# Patient Record
Sex: Male | Born: 1955 | Hispanic: Yes | Marital: Single | Smoking: Current some day smoker
Health system: Southern US, Community
[De-identification: ages and names within clinical notes are randomized; demographics above are authoritative.]

## PROBLEM LIST (undated history)

## (undated) DIAGNOSIS — D649 Anemia, unspecified: Secondary | ICD-10-CM

## (undated) DIAGNOSIS — I10 Essential (primary) hypertension: Secondary | ICD-10-CM

## (undated) DIAGNOSIS — R569 Unspecified convulsions: Secondary | ICD-10-CM

## (undated) DIAGNOSIS — E119 Type 2 diabetes mellitus without complications: Secondary | ICD-10-CM

## (undated) DIAGNOSIS — J449 Chronic obstructive pulmonary disease, unspecified: Secondary | ICD-10-CM

## (undated) HISTORY — PX: EXCISIONAL HEMORRHOIDECTOMY: SHX1541

## (undated) HISTORY — PX: OTHER SURGICAL HISTORY: SHX169

---

## 2013-08-06 ENCOUNTER — Emergency Department (HOSPITAL_COMMUNITY): Payer: Self-pay

## 2013-08-06 ENCOUNTER — Emergency Department (HOSPITAL_COMMUNITY)
Admission: EM | Admit: 2013-08-06 | Discharge: 2013-08-06 | Disposition: A | Discharge status: Home / Self Care | Payer: Self-pay | Attending: Emergency Medicine | Admitting: Emergency Medicine

## 2013-08-06 ENCOUNTER — Encounter (HOSPITAL_COMMUNITY): Payer: Self-pay | Admitting: Emergency Medicine

## 2013-08-06 DIAGNOSIS — R079 Chest pain, unspecified: Secondary | ICD-10-CM

## 2013-08-06 DIAGNOSIS — Z862 Personal history of diseases of the blood and blood-forming organs and certain disorders involving the immune mechanism: Secondary | ICD-10-CM | POA: Insufficient documentation

## 2013-08-06 DIAGNOSIS — J449 Chronic obstructive pulmonary disease, unspecified: Secondary | ICD-10-CM | POA: Insufficient documentation

## 2013-08-06 DIAGNOSIS — F17200 Nicotine dependence, unspecified, uncomplicated: Secondary | ICD-10-CM | POA: Insufficient documentation

## 2013-08-06 DIAGNOSIS — Z859 Personal history of malignant neoplasm, unspecified: Secondary | ICD-10-CM | POA: Insufficient documentation

## 2013-08-06 DIAGNOSIS — I10 Essential (primary) hypertension: Secondary | ICD-10-CM | POA: Insufficient documentation

## 2013-08-06 DIAGNOSIS — E119 Type 2 diabetes mellitus without complications: Secondary | ICD-10-CM | POA: Insufficient documentation

## 2013-08-06 DIAGNOSIS — I319 Disease of pericardium, unspecified: Secondary | ICD-10-CM | POA: Insufficient documentation

## 2013-08-06 DIAGNOSIS — Z8669 Personal history of other diseases of the nervous system and sense organs: Secondary | ICD-10-CM | POA: Insufficient documentation

## 2013-08-06 DIAGNOSIS — J4489 Other specified chronic obstructive pulmonary disease: Secondary | ICD-10-CM | POA: Insufficient documentation

## 2013-08-06 HISTORY — DX: Unspecified convulsions: R56.9

## 2013-08-06 HISTORY — DX: Type 2 diabetes mellitus without complications: E11.9

## 2013-08-06 HISTORY — DX: Anemia, unspecified: D64.9

## 2013-08-06 HISTORY — DX: Essential (primary) hypertension: I10

## 2013-08-06 HISTORY — DX: Chronic obstructive pulmonary disease, unspecified: J44.9

## 2013-08-06 LAB — COMPREHENSIVE METABOLIC PANEL
ALBUMIN: 4.2 g/dL (ref 3.5–5.2)
ALT: 65 U/L — ABNORMAL HIGH (ref 0–53)
AST: 51 U/L — AB (ref 0–37)
Alkaline Phosphatase: 104 U/L (ref 39–117)
BILIRUBIN TOTAL: 0.8 mg/dL (ref 0.3–1.2)
BUN: 19 mg/dL (ref 6–23)
CALCIUM: 9.5 mg/dL (ref 8.4–10.5)
CHLORIDE: 103 meq/L (ref 96–112)
CO2: 24 mEq/L (ref 19–32)
CREATININE: 0.94 mg/dL (ref 0.50–1.35)
GFR calc Af Amer: >90 mL/min (ref >90)
GFR calc non Af Amer: >90 mL/min (ref >90)
Glucose, Bld: 99 mg/dL (ref 70–99)
Potassium: 4 mEq/L (ref 3.7–5.3)
Sodium: 140 mEq/L (ref 137–147)
TOTAL PROTEIN: 8.2 g/dL (ref 6.0–8.3)

## 2013-08-06 LAB — CBC
HEMATOCRIT: 43 % (ref 39.0–52.0)
Hemoglobin: 14.8 g/dL (ref 13.0–17.0)
MCH: 34.3 pg — AB (ref 26.0–34.0)
MCHC: 34.4 g/dL (ref 30.0–36.0)
MCV: 99.8 fL (ref 78.0–100.0)
PLATELETS: 155 10*3/uL (ref 150–400)
RBC: 4.31 MIL/uL (ref 4.22–5.81)
RDW: 13.3 % (ref 11.5–15.5)
WBC: 4.3 10*3/uL (ref 4.0–10.5)

## 2013-08-06 LAB — TROPONIN I

## 2013-08-06 MED ORDER — ASPIRIN 325 MG PO TABS
325.0000 mg | ORAL_TABLET | Freq: Once | ORAL | Status: AC
  Administered 2013-08-06: 325 mg via ORAL
  Filled 2013-08-06: qty 1

## 2013-08-06 MED ORDER — NAPROXEN 500 MG PO TABS
500.0000 mg | ORAL_TABLET | Freq: Two times a day (BID) | ORAL | Status: DC
Start: 2013-08-06 — End: 2013-09-02

## 2013-08-06 MED ORDER — KETOROLAC TROMETHAMINE 30 MG/ML IJ SOLN
30.0000 mg | Freq: Once | INTRAMUSCULAR | Status: AC
  Administered 2013-08-06: 30 mg via INTRAVENOUS
  Filled 2013-08-06: qty 1

## 2013-08-06 NOTE — ED Provider Notes (Signed)
CSN: [id number]     Arrival date & time 08/06/13  [id number] History   First MD Initiated Contact with Patient 08/06/13 [phone]     Chief Complaint  Patient presents with  . Chest Pain     HPI Is reports rather constant left-sided pleuritic sharp chest pain with some radiation into his left arm.  Feels like he has some tingling and warmth in his left arm.  This is been rather constant over the past 2 days.  He's been told before in the past he has "silent heart attacks".  He's never had a heart catheterization.  [place] in [place].  [place].  Patient has a history of COPD, tobacco abuse, hypertension, diabetes.   Past Medical History  Diagnosis Date  . Diabetes mellitus without complication   . Hypertension   . COPD (chronic obstructive pulmonary disease)   . Cancer   . Anemia   . Seizures    Past Surgical History  Procedure Laterality Date  . Head surgery    . Excisional hemorrhoidectomy     Family History  Problem Relation Age of Onset  . Stroke Mother   . Cancer Father    History  Substance Use Topics  . Smoking status: Current Some Day Smoker -- 1.00 packs/day    Types: Cigarettes  . Smokeless tobacco: Never Used  . Alcohol Use: Yes     Comment: 5 days/week    Review of Systems  All other systems reviewed and are negative.     Allergies  Review of patient's allergies indicates no known allergies.  Home Medications   Prior to Admission medications   Not on File   BP 187/102  Pulse 51  Temp(Src) 99.2 F (37.3 C) (Oral)  Resp 14  SpO2 98% Physical Exam  Nursing note and vitals reviewed. Constitutional: He is oriented to person, place, and time. He appears well-developed and well-nourished.  HENT:  Head: Normocephalic and atraumatic.  Eyes: EOM are normal.  Neck: Normal range of motion.  Cardiovascular: Normal rate, regular rhythm, normal heart sounds and intact distal pulses.   Pulmonary/Chest: Effort normal and  breath sounds normal. No respiratory distress. He exhibits no tenderness.  Abdominal: Soft. He exhibits no distension. There is no tenderness.  Musculoskeletal: Normal range of motion.  Neurological: He is alert and oriented to person, place, and time.  Skin: Skin is warm and dry.  Psychiatric: He has a normal mood and affect. Judgment normal.    ED Course  Procedures (including critical care time) Labs Review Labs Reviewed  CBC - Abnormal; Notable for the following:    MCH 34.3 (*)    All other components within normal limits  COMPREHENSIVE METABOLIC PANEL - Abnormal; Notable for the following:    AST 51 (*)    ALT 65 (*)    All other components within normal limits  TROPONIN I  TROPONIN I    Imaging Review Dg Chest 2 View  08/06/2013   CLINICAL DATA:  Chest pain  EXAM: CHEST  2 VIEW  COMPARISON:  None.  FINDINGS: There is no edema or consolidation. The heart size and pulmonary vascularity are normal. No adenopathy. No bone lesions.  IMPRESSION: No edema or consolidation   Electronically Signed   By: Bretta BangWilliam  Woodruff M.D.   On: 08/06/2013 09:15     EKG Interpretation   Date/Time:  Monday Aug 06 2013 08:28:31 EDT Ventricular Rate:  57 PR Interval:  149 QRS Duration:  98 QT Interval:  412 QTC Calculation: 401 R Axis:   66 Text Interpretation:  Sinus rhythm diffuse ST elevation, ? pericarditis No  old tracing to compare Confirmed by Jaekwon Mcclune  MD, Caryn BeeKEVIN ([id number]) on 08/06/2013  8:44:16 AM      MDM   Final diagnoses:  Chest pain  Pericarditis    Symptoms sound more consistent with pericarditis given the short nature of his pain in his diffuse ST changes throughout his EKG without reciprocal depression.  Questionable PR depression.  Patient will be treated with Toradol.  Enzymes will be cycled.  Patient does have significant cardiac risk factors therefore 2 sets of cardiac enzymes will be obtained.  We'll continue to evaluate the patient.  His pain worsens he will get a  repeat EKG.  We'll monitor him closely.   12:00 PM Patient feels better at this time.  Discharge home in good condition.  Outpatient and cardiology followup.  Patient may need stress testing as an outpatient.  My suspicion is that this more represents pericarditis   Lyanne CoKevin M Susan Arana, MD 08/06/13 1200

## 2013-08-06 NOTE — ED Notes (Signed)
Pt family is at bedside and states pt pain is worse when he is drinking.

## 2013-08-06 NOTE — ED Notes (Signed)
Patient states he has had chest pain x 2 days intermittently and came today because he had 2 episodes of chest pain in one day. Patient states he had SOB and weakness. Patient denies any N/v. Chest pain radiates to left arm and is tingling.

## 2013-08-06 NOTE — ED Notes (Signed)
Pt is on keppra 500 mg BID Enalapril 5 mg daily Pt family member states he is not taking his medication every day and has not had them today.

## 2013-08-06 NOTE — ED Notes (Signed)
MD at bedside.  EDP Campos present at  discharge

## 2013-08-06 NOTE — ED Notes (Signed)
Transported to xray 

## 2013-08-06 NOTE — ED Notes (Signed)
Pt last drink was Saturday.

## 2013-08-06 NOTE — Discharge Instructions (Signed)
Chest Pain (Nonspecific) °It is often hard to give a specific diagnosis for the cause of chest pain. There is always a chance that your pain could be related to something serious, such as a heart attack or a blood clot in the lungs. You need to follow up with your caregiver for further evaluation. °CAUSES  °· Heartburn. °· Pneumonia or bronchitis. °· Anxiety or stress. °· Inflammation around your heart (pericarditis) or lung (pleuritis or pleurisy). °· A blood clot in the lung. °· A collapsed lung (pneumothorax). It can develop suddenly on its own (spontaneous pneumothorax) or from injury (trauma) to the chest. °· Shingles infection (herpes zoster virus). °The chest wall is composed of bones, muscles, and cartilage. Any of these can be the source of the pain. °· The bones can be bruised by injury. °· The muscles or cartilage can be strained by coughing or overwork. °· The cartilage can be affected by inflammation and become sore (costochondritis). °DIAGNOSIS  °Lab tests or other studies, such as X-rays, electrocardiography, stress testing, or cardiac imaging, may be needed to find the cause of your pain.  °TREATMENT  °· Treatment depends on what may be causing your chest pain. Treatment may include: °· Acid blockers for heartburn. °· Anti-inflammatory medicine. °· Pain medicine for inflammatory conditions. °· Antibiotics if an infection is present. °· You may be advised to change lifestyle habits. This includes stopping smoking and avoiding alcohol, caffeine, and chocolate. °· You may be advised to keep your head raised (elevated) when sleeping. This reduces the chance of acid going backward from your stomach into your esophagus. °· Most of the time, nonspecific chest pain will improve within 2 to 3 days with rest and mild pain medicine. °HOME CARE INSTRUCTIONS  °· If antibiotics were prescribed, take your antibiotics as directed. Finish them even if you start to feel better. °· For the next few days, avoid physical  activities that bring on chest pain. Continue physical activities as directed. °· Do not smoke. °· Avoid drinking alcohol. °· Only take over-the-counter or prescription medicine for pain, discomfort, or fever as directed by your caregiver. °· Follow your caregiver's suggestions for further testing if your chest pain does not go away. °· Keep any follow-up appointments you made. If you do not go to an appointment, you could develop lasting (chronic) problems with pain. If there is any problem keeping an appointment, you must call to reschedule. °SEEK MEDICAL CARE IF:  °· You think you are having problems from the medicine you are taking. Read your medicine instructions carefully. °· Your chest pain does not go away, even after treatment. °· You develop a rash with blisters on your chest. °SEEK IMMEDIATE MEDICAL CARE IF:  °· You have increased chest pain or pain that spreads to your arm, neck, jaw, back, or abdomen. °· You develop shortness of breath, an increasing cough, or you are coughing up blood. °· You have severe back or abdominal pain, feel nauseous, or vomit. °· You develop severe weakness, fainting, or chills. °· You have a fever. °THIS IS AN EMERGENCY. Do not wait to see if the pain will go away. Get medical help at once. Call your local emergency services (911 in [place] Do not drive yourself to the hospital. °MAKE SURE YOU:  °· Understand these instructions. °· Will watch your condition. °· Will get help right away if you are not doing well or get worse. °Document Released: 12/16/2004 Document Revised: 05/31/2011 Document Reviewed: 10/12/2007 °ExitCare® Patient Information ©2014 [employer],   LLC.  Pericarditis Pericarditis is swelling (inflammation) of the pericardium. The pericardium is a thin, double-layered, fluid-filled tissue sac that surrounds the heart. The purpose of the pericardium is to contain the heart in the chest cavity and keep the heart from overexpanding. Different types of pericarditis  can occur, such as:  Acute pericarditis. Inflammation can develop suddenly in acute pericarditis.  Chronic pericarditis. Inflammation develops gradually and is long-lasting in chronic pericarditis.  Constrictive pericarditis. In this type of pericarditis, the layers of the pericardium stiffen and develop scar tissue. The scar tissue thickens and sticks together. This makes it difficult for the heart to pump and work as it normally does. CAUSES  Pericarditis can be caused from different conditions, such as:  A bacterial, fungal or viral infection.  After a heart attack (myocardial infarction).  After open-heart surgery (coronary bypass graft surgery).  Auto-immune conditions such as lupus, rheumatoid arthritis or scleroderma.  Kidney failure.  Low thyroid condition (hypothyroidism).  Cancer from another part of the body that has spread (metastasized) to the pericardium.  Chest injury or trauma.  After radiation treatment.  Certain medicines. SYMPTOMS  Symptoms of pericarditis can include:  Chest pain. Chest pain symptoms may increase when laying down and may be relieved when sitting up and leaning forward.  A chronic, dry cough.  Heart palpitations. These may feel like rapid, fluttering or pounding heart beats.  Chest pain may be worse when swallowing.  Dizziness or fainting.  Tiredness, fatigue or lethargy.  Fever. DIAGNOSIS  Pericarditis is diagnosed by the following:  A physical exam. A heart sound called a pericardial friction rub may be heard when your caregiver listens to your heart.  Blood work. Blood may be drawn to check for an infection and to look at your blood chemistry.  Electrocardiography. During electrocardiography your heart's electrical activity is monitored and recorded with a tracing on paper (electrocardiogram [ECG]).  Echocardiography.  Computed tomography (CT).  Magnetic resonance image (MRI). TREATMENT  To treat pericarditis, it is  important to know the cause of it. The cause of pericarditis determines the treatment.   If the cause of pericarditis is due to an infection, treatment is based on the type of infection. If an infection is suspected in the pericardial fluid, a procedure called a pericardial fluid culture and biopsy may be done. This takes a sample of the pericardial fluid. The sample is sent to a lab which runs tests on the pericardial fluid to check for an infection.  If the autoimmune disease is the cause, treatment of the autoimmune condition will help improve the pericarditis.  If the cause of pericarditis is not known, anti-inflammatory medicines may be used to help decrease the inflammation.  Surgery may be needed. The following are types of surgeries or procedures that may be done to treat pericarditis:  Pericardial window. A pericardial window makes a cut (incision) into the pericardial sac. This allows excess fluid in the pericardium to drain.  Pericardiocentesis. A pericardiocentesis is also known as a pericardial tap. This procedure uses a needle that is guided by X-ray to drain (aspirate) excess fluid from the pericardium.  Pericardiectomy. A pericardiectomy removes part or all of the pericardium. HOME CARE INSTRUCTIONS   Do not smoke. If you smoke, quit. Your caregiver can help you quit smoking.  Maintain a healthy weight.  Follow an exercise program as told by your caregiver.  If you drink alcohol, do so in moderation.  Eat a heart healthy diet. A registered dietician can help  you learn about healthy food choices.  Keep a list of all your medicines with you at all times. Include the name, dose, how often it is taken and how it is taken. SEEK IMMEDIATE MEDICAL CARE IF:   You have chest pain or feelings of chest pressure.  You have sweating (diaphoresis) when at rest.  You have irregular heartbeats (palpitations).  You have rapid, racing heart beats.  You have unexplained fainting  episodes.  You feel sick to your stomach (nausea) or vomiting without cause.  You have unexplained weakness. If you develop any of the symptoms which originally made you seek care, call for local emergency medical help. Do not drive yourself to the hospital. Document Released: 09/01/2000 Document Revised: 05/31/2011 Document Reviewed: 03/10/2011 [facility] Patient Information 2014 [employer] [place] [place].

## 2013-08-06 NOTE — ED Notes (Signed)
Pt states he has intermittent chest pain since saturday. Pt has not started any new exercise and has not performed strenuous activity over the weekend.

## 2013-08-06 NOTE — ED Notes (Signed)
Pt drinks 4 40 oz beers 5 times a week.

## 2013-08-06 NOTE — Progress Notes (Signed)
[facility] provided pt with a list of primary care resources to help patient establish primary care.  °

## 2013-09-02 ENCOUNTER — Encounter (HOSPITAL_COMMUNITY): Payer: Self-pay | Admitting: Emergency Medicine

## 2013-09-02 ENCOUNTER — Emergency Department (HOSPITAL_COMMUNITY): Payer: Self-pay

## 2013-09-02 ENCOUNTER — Emergency Department (HOSPITAL_COMMUNITY)
Admission: EM | Admit: 2013-09-02 | Discharge: 2013-09-02 | Disposition: A | Discharge status: Home / Self Care | Payer: Self-pay | Attending: Emergency Medicine | Admitting: Emergency Medicine

## 2013-09-02 DIAGNOSIS — Z791 Long term (current) use of non-steroidal anti-inflammatories (NSAID): Secondary | ICD-10-CM | POA: Insufficient documentation

## 2013-09-02 DIAGNOSIS — R079 Chest pain, unspecified: Secondary | ICD-10-CM

## 2013-09-02 DIAGNOSIS — J4489 Other specified chronic obstructive pulmonary disease: Secondary | ICD-10-CM | POA: Insufficient documentation

## 2013-09-02 DIAGNOSIS — M79609 Pain in unspecified limb: Secondary | ICD-10-CM | POA: Insufficient documentation

## 2013-09-02 DIAGNOSIS — J449 Chronic obstructive pulmonary disease, unspecified: Secondary | ICD-10-CM | POA: Insufficient documentation

## 2013-09-02 DIAGNOSIS — F1010 Alcohol abuse, uncomplicated: Secondary | ICD-10-CM | POA: Insufficient documentation

## 2013-09-02 DIAGNOSIS — Z862 Personal history of diseases of the blood and blood-forming organs and certain disorders involving the immune mechanism: Secondary | ICD-10-CM | POA: Insufficient documentation

## 2013-09-02 DIAGNOSIS — G40909 Epilepsy, unspecified, not intractable, without status epilepticus: Secondary | ICD-10-CM | POA: Insufficient documentation

## 2013-09-02 DIAGNOSIS — I10 Essential (primary) hypertension: Secondary | ICD-10-CM | POA: Insufficient documentation

## 2013-09-02 DIAGNOSIS — Z859 Personal history of malignant neoplasm, unspecified: Secondary | ICD-10-CM | POA: Insufficient documentation

## 2013-09-02 DIAGNOSIS — R071 Chest pain on breathing: Secondary | ICD-10-CM | POA: Insufficient documentation

## 2013-09-02 DIAGNOSIS — F10929 Alcohol use, unspecified with intoxication, unspecified: Secondary | ICD-10-CM

## 2013-09-02 DIAGNOSIS — E119 Type 2 diabetes mellitus without complications: Secondary | ICD-10-CM | POA: Insufficient documentation

## 2013-09-02 DIAGNOSIS — Z79899 Other long term (current) drug therapy: Secondary | ICD-10-CM | POA: Insufficient documentation

## 2013-09-02 DIAGNOSIS — I252 Old myocardial infarction: Secondary | ICD-10-CM | POA: Insufficient documentation

## 2013-09-02 LAB — BASIC METABOLIC PANEL
BUN: 13 mg/dL (ref 6–23)
CO2: 20 mEq/L (ref 19–32)
Calcium: 9.2 mg/dL (ref 8.4–10.5)
Chloride: 106 mEq/L (ref 96–112)
Creatinine, Ser: 1.01 mg/dL (ref 0.50–1.35)
GFR, EST NON AFRICAN AMERICAN: 81 mL/min — AB (ref >90)
Glucose, Bld: 100 mg/dL — ABNORMAL HIGH (ref 70–99)
Potassium: 3.8 mEq/L (ref 3.7–5.3)
SODIUM: 141 meq/L (ref 137–147)

## 2013-09-02 LAB — CBC WITH DIFFERENTIAL/PLATELET
BASOS PCT: 1 % (ref 0–1)
Basophils Absolute: 0 10*3/uL (ref 0.0–0.1)
EOS ABS: 0.3 10*3/uL (ref 0.0–0.7)
Eosinophils Relative: 5 % (ref 0–5)
HCT: 40.7 % (ref 39.0–52.0)
Hemoglobin: 14.1 g/dL (ref 13.0–17.0)
Lymphocytes Relative: 55 % — ABNORMAL HIGH (ref 12–46)
Lymphs Abs: 3.6 10*3/uL (ref 0.7–4.0)
MCH: 34.2 pg — AB (ref 26.0–34.0)
MCHC: 34.6 g/dL (ref 30.0–36.0)
MCV: 98.8 fL (ref 78.0–100.0)
Monocytes Absolute: 0.6 10*3/uL (ref 0.1–1.0)
Monocytes Relative: 10 % (ref 3–12)
NEUTROS ABS: 1.8 10*3/uL (ref 1.7–7.7)
NEUTROS PCT: 29 % — AB (ref 43–77)
Platelets: 174 10*3/uL (ref 150–400)
RBC: 4.12 MIL/uL — AB (ref 4.22–5.81)
RDW: 13.4 % (ref 11.5–15.5)
WBC: 6.4 10*3/uL (ref 4.0–10.5)

## 2013-09-02 LAB — I-STAT TROPONIN, ED
TROPONIN I, POC: 0.01 ng/mL (ref 0.00–0.08)
Troponin i, poc: 0 ng/mL (ref 0.00–0.08)

## 2013-09-02 LAB — RAPID URINE DRUG SCREEN, HOSP PERFORMED
Amphetamines: NOT DETECTED
Barbiturates: NOT DETECTED
Benzodiazepines: NOT DETECTED
Cocaine: NOT DETECTED
Opiates: POSITIVE — AB
Tetrahydrocannabinol: NOT DETECTED

## 2013-09-02 MED ORDER — MORPHINE SULFATE 4 MG/ML IJ SOLN
4.0000 mg | Freq: Once | INTRAMUSCULAR | Status: AC
  Administered 2013-09-02: 4 mg via INTRAVENOUS
  Filled 2013-09-02: qty 1

## 2013-09-02 MED ORDER — IBUPROFEN 600 MG PO TABS: 600.0000 mg | ORAL_TABLET | Freq: Four times a day (QID) | ORAL | Status: DC | PRN

## 2013-09-02 MED ORDER — TRAMADOL HCL 50 MG PO TABS: 50.0000 mg | ORAL_TABLET | Freq: Four times a day (QID) | ORAL | Status: DC | PRN

## 2013-09-02 MED ORDER — ONDANSETRON HCL 4 MG/2ML IJ SOLN
4.0000 mg | Freq: Once | INTRAMUSCULAR | Status: AC
  Administered 2013-09-02: 4 mg via INTRAVENOUS
  Filled 2013-09-02: qty 2

## 2013-09-02 NOTE — ED Notes (Signed)
Per EMS ,pt.from home with complaint of chest pain which started at 1200 midnight,pt. Claimed that chest pain only happened every time he steps on his right foot and this has been going on and off for 2 years!!! Alert and oriented x 3,  Etoh.  No SOB.

## 2013-09-02 NOTE — Discharge Instructions (Signed)
Alcohol Intoxication  Alcohol intoxication occurs when you drink enough alcohol that it affects your ability to function. It can be mild or very severe. Drinking a lot of alcohol in a short time is called binge drinking. This can be very harmful. Drinking alcohol can also be more dangerous if you are taking medicines or other drugs. Some of the effects caused by alcohol may include:  · Loss of coordination.  · Changes in mood and behavior.  · Unclear thinking.  · Trouble talking (slurred speech).  · Throwing up (vomiting).  · Confusion.  · Slowed breathing.  · Twitching and shaking (seizures).  · Loss of consciousness.  HOME CARE  · Do not drive after drinking alcohol.  · Drink enough water and fluids to keep your pee (urine) clear or pale yellow. Avoid caffeine.  · Only take medicine as told by your doctor.  GET HELP IF:  · You throw up (vomit) many times.  · You do not feel better after a few days.  · You frequently have alcohol intoxication. Your doctor can help decide if you should see a substance use treatment counselor.  GET HELP RIGHT AWAY IF:  · You become shaky when you stop drinking.  · You have twitching and shaking.  · You throw up blood. It may look bright red or like coffee grounds.  · You notice blood in your poop (bowel movements).  · You become lightheaded or pass out (faint).  MAKE SURE YOU:   · Understand these instructions.  · Will watch your condition.  · Will get help right away if you are not doing well or get worse.  Document Released: 08/25/2007 Document Revised: 11/08/2012 Document Reviewed: 08/11/2012  ExitCare® Patient Information ©2014 [employer].

## 2013-09-02 NOTE — ED Provider Notes (Addendum)
CSN: [id number]     Arrival date & time 09/02/13  0048 History   First MD Initiated Contact with Patient 09/02/13 0059     Chief Complaint  Patient presents with  . Chest Pain  . right foot pain   . Alcohol Intoxication     (Consider location/radiation/quality/duration/timing/severity/associated sxs/prior Treatment) HPI Assessment 58 year old man who reports that he has a history of coronary artery disease-MI in the remote past. Past medical history is also notable for diabetes, hypertension, COPD, tobacco abuse.  He presents with sudden onset of left-sided chest pain which is aching. It began after he had a couple of beers. He denies abdominal pain. He has not had shortness of breath, diaphoresis or lightheadedness. Pain is sharp and radiates to left shoulder. Patient denies recent cocaine use.  Past Medical History  Diagnosis Date  . Diabetes mellitus without complication   . Hypertension   . COPD (chronic obstructive pulmonary disease)   . Cancer   . Anemia   . Seizures    Past Surgical History  Procedure Laterality Date  . Head surgery    . Excisional hemorrhoidectomy     Family History  Problem Relation Age of Onset  . Stroke Mother   . Cancer Father    History  Substance Use Topics  . Smoking status: Current Some Day Smoker -- 1.00 packs/day    Types: Cigarettes  . Smokeless tobacco: Never Used  . Alcohol Use: Yes     Comment: 5 days/week    Review of Systems Ten point review of symptoms performed and is negative with the exception of symptoms noted above.     Allergies  Review of patient's allergies indicates no known allergies.  Home Medications   Prior to Admission medications   Medication Sig Start Date End Date Taking? Authorizing Provider  albuterol (PROVENTIL HFA;VENTOLIN HFA) 108 (90 BASE) MCG/ACT inhaler Inhale 2 puffs into the lungs every 6 (six) hours as needed for wheezing or shortness of breath.    Historical Provider, MD  enalapril  (VASOTEC) 5 MG tablet Take 5 mg by mouth daily.    Historical Provider, MD  levETIRAcetam (KEPPRA) 500 MG tablet Take 500 mg by mouth 2 (two) times daily.    Historical Provider, MD  naproxen (NAPROSYN) 500 MG tablet Take 1 tablet (500 mg total) by mouth 2 (two) times daily. 08/06/13   Lyanne CoKevin M Campos, MD   BP 150/73  Pulse 74  Temp(Src) 98.4 F (36.9 C) (Oral)  Resp 19  SpO2 96% Physical Exam Gen: well developed and well nourished appearing Head: NCAT Eyes: PERL, EOMI Nose: no epistaixis or rhinorrhea Mouth/throat: mucosa is moist and pink Neck: supple, no stridor Chest wall: left sided chest wall tenderness with reproducible pain on palpation, pain with external rotation of left shoulder Lungs: CTA B, no wheezing, rhonchi or rales CV: RRR, no murmur, extremities appear well perfused.  Abd: soft, notender, nondistended Back: no ttp, no cva ttp Skin: warm and dry Ext: normal to inspection, no dependent edema Neuro: CN ii-xii grossly intact, no focal deficits Psyche; normal affect,  calm and cooperative.   ED Course  Procedures (including critical care time) Labs Review  EKG: nsr, no acute ischemic changes, normal intervals, normal axis, normal qrs complex  CXR: normal cardiac silloute, normal appearing mediastinum, no infiltrates, no acute process identified.  MDM   DDX: ACS, pneumothorax, pneumonia, pericardial or pleural effusion, gastritis, GERD/PUD, musculoskeletal pain.   Abdominal exam is benign. Suspect myofascial pain as the patient  is tender over the region of pain.    [id number]: ED work up non-diagnostic with normal first troponin. Patient is pain free. Will recheck 2nd set at 3 hour interval. If negative, plan to discharge home.   Brandt LoosenJulie Lavayah Vita, MD 09/02/13 [phone]  [id number]: Patient remains CP free. 2nd troponin is wnl.   Brandt LoosenJulie Javaeh Muscatello, MD 09/02/13 [phone]

## 2013-09-02 NOTE — ED Notes (Signed)
MD at bedside. 

## 2013-09-02 NOTE — ED Notes (Signed)
Bed: [id number] Expected date:  Expected time:  Means of arrival:  Comments: EMS 65M leg pain, chest pain, etoh

## 2013-09-02 NOTE — ED Notes (Signed)
Patient transported to X-ray 

## 2014-07-08 ENCOUNTER — Emergency Department (HOSPITAL_COMMUNITY)
Admission: EM | Admit: 2014-07-08 | Discharge: 2014-07-08 | Disposition: A | Discharge status: Home / Self Care | Payer: Medicaid Other | Attending: Emergency Medicine | Admitting: Emergency Medicine

## 2014-07-08 ENCOUNTER — Emergency Department (HOSPITAL_COMMUNITY): Payer: Self-pay

## 2014-07-08 ENCOUNTER — Encounter (HOSPITAL_COMMUNITY): Payer: Self-pay | Admitting: Emergency Medicine

## 2014-07-08 DIAGNOSIS — Z79899 Other long term (current) drug therapy: Secondary | ICD-10-CM | POA: Insufficient documentation

## 2014-07-08 DIAGNOSIS — E119 Type 2 diabetes mellitus without complications: Secondary | ICD-10-CM | POA: Insufficient documentation

## 2014-07-08 DIAGNOSIS — R079 Chest pain, unspecified: Secondary | ICD-10-CM | POA: Insufficient documentation

## 2014-07-08 DIAGNOSIS — Z85038 Personal history of other malignant neoplasm of large intestine: Secondary | ICD-10-CM | POA: Insufficient documentation

## 2014-07-08 DIAGNOSIS — Z720 Tobacco use: Secondary | ICD-10-CM | POA: Insufficient documentation

## 2014-07-08 DIAGNOSIS — I10 Essential (primary) hypertension: Secondary | ICD-10-CM | POA: Insufficient documentation

## 2014-07-08 DIAGNOSIS — J449 Chronic obstructive pulmonary disease, unspecified: Secondary | ICD-10-CM | POA: Insufficient documentation

## 2014-07-08 DIAGNOSIS — G40909 Epilepsy, unspecified, not intractable, without status epilepticus: Secondary | ICD-10-CM | POA: Insufficient documentation

## 2014-07-08 DIAGNOSIS — Z862 Personal history of diseases of the blood and blood-forming organs and certain disorders involving the immune mechanism: Secondary | ICD-10-CM | POA: Insufficient documentation

## 2014-07-08 LAB — BASIC METABOLIC PANEL
Anion gap: 10 (ref 5–15)
BUN: 18 mg/dL (ref 6–23)
CO2: 22 mmol/L (ref 19–32)
Calcium: 9.3 mg/dL (ref 8.4–10.5)
Chloride: 107 mmol/L (ref 96–112)
Creatinine, Ser: 0.92 mg/dL (ref 0.50–1.35)
GFR calc Af Amer: >90 mL/min (ref >90)
GFR calc non Af Amer: >90 mL/min (ref >90)
Glucose, Bld: 146 mg/dL — ABNORMAL HIGH (ref 70–99)
Potassium: 3.6 mmol/L (ref 3.5–5.1)
Sodium: 139 mmol/L (ref 135–145)

## 2014-07-08 LAB — CBC
HCT: 37.6 % — ABNORMAL LOW (ref 39.0–52.0)
Hemoglobin: 12.6 g/dL — ABNORMAL LOW (ref 13.0–17.0)
MCH: 34.8 pg — ABNORMAL HIGH (ref 26.0–34.0)
MCHC: 33.5 g/dL (ref 30.0–36.0)
MCV: 103.9 fL — ABNORMAL HIGH (ref 78.0–100.0)
Platelets: 171 10*3/uL (ref 150–400)
RBC: 3.62 MIL/uL — ABNORMAL LOW (ref 4.22–5.81)
RDW: 14.8 % (ref 11.5–15.5)
WBC: 4.7 10*3/uL (ref 4.0–10.5)

## 2014-07-08 LAB — TROPONIN I: Troponin I: <0.03 ng/mL (ref <0.031)

## 2014-07-08 LAB — I-STAT TROPONIN, ED: Troponin i, poc: 0 ng/mL (ref 0.00–0.08)

## 2014-07-08 LAB — BRAIN NATRIURETIC PEPTIDE: B Natriuretic Peptide: 13.6 pg/mL (ref 0.0–100.0)

## 2014-07-08 MED ORDER — IBUPROFEN 200 MG PO TABS
600.0000 mg | ORAL_TABLET | Freq: Once | ORAL | Status: AC
Start: 2014-07-08 — End: 2014-07-08
  Administered 2014-07-08: 600 mg via ORAL
  Filled 2014-07-08: qty 3

## 2014-07-08 MED ORDER — OXYCODONE-ACETAMINOPHEN 5-325 MG PO TABS
2.0000 | ORAL_TABLET | Freq: Once | ORAL | Status: AC
  Administered 2014-07-08: 2 via ORAL
  Filled 2014-07-08: qty 2

## 2014-07-08 NOTE — ED Notes (Signed)
Pt c/o sharp left sided chest pain, SOB, left sided arm and jaw pain, and lightheadedness onset several months ago.

## 2014-07-08 NOTE — Discharge Instructions (Signed)
Chest Pain (Nonspecific) °It is often hard to give a specific diagnosis for the cause of chest pain. There is always a chance that your pain could be related to something serious, such as a heart attack or a blood clot in the lungs. You need to follow up with your health care provider for further evaluation. °CAUSES  °· Heartburn. °· Pneumonia or bronchitis. °· Anxiety or stress. °· Inflammation around your heart (pericarditis) or lung (pleuritis or pleurisy). °· A blood clot in the lung. °· A collapsed lung (pneumothorax). It can develop suddenly on its own (spontaneous pneumothorax) or from trauma to the chest. °· Shingles infection (herpes zoster virus). °The chest wall is composed of bones, muscles, and cartilage. Any of these can be the source of the pain. °· The bones can be bruised by injury. °· The muscles or cartilage can be strained by coughing or overwork. °· The cartilage can be affected by inflammation and become sore (costochondritis). °DIAGNOSIS  °Lab tests or other studies may be needed to find the cause of your pain. Your health care provider may have you take a test called an ambulatory electrocardiogram (ECG). An ECG records your heartbeat patterns over a 24-hour period. You may also have other tests, such as: °· Transthoracic echocardiogram (TTE). During echocardiography, sound waves are used to evaluate how blood flows through your heart. °· Transesophageal echocardiogram (TEE). °· Cardiac monitoring. This allows your health care provider to monitor your heart rate and rhythm in real time. °· Holter monitor. This is a portable device that records your heartbeat and can help diagnose heart arrhythmias. It allows your health care provider to track your heart activity for several days, if needed. °· Stress tests by exercise or by giving medicine that makes the heart beat faster. °TREATMENT  °· Treatment depends on what may be causing your chest pain. Treatment may include: °¨ Acid blockers for  heartburn. °¨ Anti-inflammatory medicine. °¨ Pain medicine for inflammatory conditions. °¨ Antibiotics if an infection is present. °· You may be advised to change lifestyle habits. This includes stopping smoking and avoiding alcohol, caffeine, and chocolate. °· You may be advised to keep your head raised (elevated) when sleeping. This reduces the chance of acid going backward from your stomach into your esophagus. °Most of the time, nonspecific chest pain will improve within 2-3 days with rest and mild pain medicine.  °HOME CARE INSTRUCTIONS  °· If antibiotics were prescribed, take them as directed. Finish them even if you start to feel better. °· For the next few days, avoid physical activities that bring on chest pain. Continue physical activities as directed. °· Do not use any tobacco products, including cigarettes, chewing tobacco, or electronic cigarettes. °· Avoid drinking alcohol. °· Only take medicine as directed by your health care provider. °· Follow your health care provider's suggestions for further testing if your chest pain does not go away. °· Keep any follow-up appointments you made. If you do not go to an appointment, you could develop lasting (chronic) problems with pain. If there is any problem keeping an appointment, call to reschedule. °SEEK MEDICAL CARE IF:  °· Your chest pain does not go away, even after treatment. °· You have a rash with blisters on your chest. °· You have a fever. °SEEK IMMEDIATE MEDICAL CARE IF:  °· You have increased chest pain or pain that spreads to your arm, neck, jaw, back, or abdomen. °· You have shortness of breath. °· You have an increasing cough, or you cough   up blood. °· You have severe back or abdominal pain. °· You feel nauseous or vomit. °· You have severe weakness. °· You faint. °· You have chills. °This is an emergency. Do not wait to see if the pain will go away. Get medical help at once. Call your local emergency services (911 in [place] Do not drive  yourself to the hospital. °MAKE SURE YOU:  °· Understand these instructions. °· Will watch your condition. °· Will get help right away if you are not doing well or get worse. °Document Released: 12/16/2004 Document Revised: 03/13/2013 Document Reviewed: 10/12/2007 °ExitCare® Patient Information ©2015 [employer]. This information is not intended to replace advice given to you by your health care provider. Make sure you discuss any questions you have with your health care provider. ° ° °Emergency Department Resource Guide °1) Find a Doctor and Pay Out of Pocket °Although you won't have to find out who is covered by your insurance plan, it is a good idea to ask around and get recommendations. You will then need to call the office and see if the doctor you have chosen will accept you as a new patient and what types of options they offer for patients who are self-pay. Some doctors offer discounts or will set up payment plans for their patients who do not have insurance, but you will need to ask so you aren't surprised when you get to your appointment. ° °2) Contact Your Local Health Department °Not all health departments have doctors that can see patients for sick visits, but many do, so it is worth a call to see if yours does. If you don't know where your local health department is, you can check in your phone book. The CDC also has a tool to help you locate your state's health department, and many state websites also have listings of all of their local health departments. ° °3) Find a Walk-in Clinic °If your illness is not likely to be very severe or complicated, you may want to try a walk in clinic. These are popping up all over the country in pharmacies, drugstores, and shopping centers. They're usually staffed by nurse practitioners or physician assistants that have been trained to treat common illnesses and complaints. They're usually fairly quick and inexpensive. However, if you have serious medical issues or  chronic medical problems, these are probably not your best option. ° °No Primary Care Doctor: °- Call [facility] at  832-8000 - they can help you locate a primary care doctor that  accepts your insurance, provides certain services, etc. °- Physician Referral Service- 1-800-533-3463 ° °Chronic Pain Problems: °Organization         Address  Phone   Notes  °[facility]  (336) 297-2271 Patients need to be referred by their primary care doctor.  ° °Medication Assistance: °Organization         Address  Phone   Notes  °[place] [facility] [address] °[place], [place] [address] (336) 641-8030 --Must be a resident of [place] °-- Must have NO insurance coverage whatsoever (no Medicaid/ Medicare, etc.) °-- The pt. MUST have a primary care doctor that directs their care regularly and follows them in the community °  °[facility]  (866) 331-1348   °[employer]  (888) 892-1162   ° °Agencies that provide inexpensive medical care: °Organization         Address  Phone   Notes  °[facility]  (  336) 832-8035   °[facility]    (336) 832-7272   °[facility] [address] °[place], [place] [address] (336) 832-4777   °[facility] [address], °[place] (336) 271-4999   °[facility]    (336) 373-0678   °[facility]    (336) 272-1050   °[facility] ° [address], [place] Phone:  (336) 832-4444, Fax:  (336) 832-4440 Hours of Operation:  9 am - 6 pm, M-F.  Also accepts Medicaid/Medicare and self-pay.  °[facility] ° [address], [place] Phone: (336) 832-3150, Fax: (336) 832-3151. Hours of Operation:  8:30 am - 5:30 pm, M-F.  Also accepts Medicaid and self-pay.  °[facility] [place] [address], [place] Phone: (336) 878-6027   °[facility] [address], [place], [place]  (336)723-1848, Ext. 123 Mondays & Thursdays: 7-9 AM.  First 15 patients are seen on a first come, first serve basis. °  ° °Medicaid-accepting [place] Providers: ° °Organization         Address  Phone   Notes  °[facility] [address], [place] (336) 641-2100 Also accepts self-pay patients.  °[facility] [address], [place] ° (336) 856-9996   °[facility] [address], [place] (336) 288-8857   °[facility] [address] [place] [address], [place] (336) 299-7000   °Veita Bland [address], [place]  ° (336) 373-1557 Only accepts [place] Access Medicaid patients after they have their name applied to their card.  ° °Self-Pay (no insurance) in [place]: ° °Organization         Address  Phone   Notes  °Sickle Cell Patients, [facility] [address], [place] (336) 832-1970   °[facility] [address], [place] (336) 832-4400   °[facility] ° [address], [place] (336) 992-4800   °[facility]/Dr. Osei-Bonsu ° [address] [place] [address], [place] or [address], [place] (336) 841-8500 Phone number for both [place] and [place] locations is the same.  °[facility] [address], [place] (336) 299-0000   °[facility] [address] [place] [address], [place] or [address] (336) 852-7530 °(336) 878-2260   °[facility] [address],  (336) 350-1642, phone; (336) 294-5005, fax Sees patients 1st and 3rd Saturday of every month.  Must not qualify for public or private insurance (i.e. Medicaid, Medicare, [place] Health Choice, Veterans' Benefits) • Household income should be no more than 200% of the poverty level •The clinic cannot treat you if you are pregnant or think you are pregnant • Sexually transmitted  diseases are not treated at the clinic.  ° ° °Dental Care: °Organization         Address  Phone  Notes  °[facility] [address],  (336) 641-6152 Accepts children up to age 21 who are enrolled in Medicaid or [place] Health Choice; pregnant women with a Medicaid card; and children who have applied for Medicaid or  Health Choice, but were declined, whose parents can pay a reduced fee at time of service.  °[facility] [place]    [address], [place] (336) 641-7733 Accepts children up to age 21 who are enrolled in Medicaid or [place] Health Choice; pregnant women with a Medicaid card; and children who have applied for Medicaid or [place] Health Choice, but were declined, whose parents can pay a reduced fee at time of service.  °[facility] ° [address], [place] (336) 641-4533 Patients are seen by appointment only. Walk-ins are not accepted. [facility] will see patients 18 years of age and older. °Monday - Tuesday (8am-5pm) °Most Wednesdays (8:30-5pm) °$30 per visit, cash only  °[facility] ° [address], [place] (336) 641-4533 Patients are seen by appointment only. Walk-ins are not accepted. [facility] will see patients 18 years of age and older. °One Wednesday Evening (Monthly: Volunteer Based).  $30 per visit, cash only  °[facility]  (919) 537-3737 for adults; Children under age 4, call Graduate Pediatric Dentistry at ([phone]) [phone]. Children aged 4-14, please call (919) [phone] to request a pediatric application. ° Dental services are provided in all areas of dental care including fillings, crowns and bridges, complete and partial dentures, implants, gum treatment, root canals, and extractions. Preventive care is also provided. Treatment is provided to both adults and children. °Patients are selected via a  lottery and there is often a waiting list. °  °[facility] [address], °[place] ° (336) 763-8833 [email or web address] °  °[facility] [address], [place], [place] (336)723-1848, Ext. 123 Second and Fourth Thursday of each month, opens at 6:30 AM; Clinic ends at 9 AM.  Patients are seen on a first-come first-served basis, and a limited number are seen during each clinic.  ° °[facility] ° [address], [place], [place] (336) 723-7904   Eligibility Requirements °You must have lived in [place], [place], or [place] counties for at least the last three months. °  You cannot be eligible for state or federal sponsored healthcare insurance, including Veterans Administration, Medicaid, or Medicare. °  You generally cannot be eligible for healthcare insurance through your employer.  °  How to apply: °Eligibility screenings are held every Tuesday and Wednesday afternoon from 1:00 pm until 4:00 pm. You do not need an appointment for the interview!  °[facility] [address], [place], [place] 336-631-2330   °[facility]  336-342-8273   °[facility]  336-703-3100   °[facility]  336-570-6415   ° °Behavioral Health Resources in the Community: °Intensive Outpatient Programs °Organization         Address  Phone  Notes  °[facility] [address], [place], [place] 336-878-6098   °[facility] [address], [place], [place] 336-832-9800   °[facility] [address], [place], [place] ° 336-882-2125   °[facility] [address],  °[place],  1-800-853-5163 or 336-641-4981   °Substance Abuse Resources °Organization         Address  Phone  Notes  °[facility]  336-882-2125   °[facility]  336-784-9470   °[facility]  336-285-9073   °[facility]  336-845-3988   °Residential &  Outpatient Substance Abuse Program  1-800-659-3381   °Psychological Services °Organization         Address  Phone  Notes  °[facility]  336- 832-9600   °  [facility]  336- 378-7881   °[facility] [address], [place] 1-800-853-5163 or 336-641-4981   ° °Mobile Crisis Teams °Organization         Address  Phone  Notes  °[facility], Mobile Crisis Care Unit  1-877-626-1772   °Assertive °[facility] ° [address] [place], [place] 336-834-9664   °Sharon DeEsch [address] °[place] 336-554-5454   ° °Self-Help/Support Groups °Organization         Address  Phone             Notes  °[facility] - variety of support groups  336- 373-1402 Call for more information  °Narcotics Anonymous (NA), [facility] [address], °[place]  2 meetings at this location  ° °Residential Treatment Programs °Organization         Address  Phone  Notes  °[facility] [address],    °[place]  1-866-801-8205   °[facility] ° [address], [place], [place] 704-293-8524   °[facility] [address], [place] 336-845-3988 Admissions: 8am-3pm M-F  °[facility] [address]    °[place], [place] 336-841-1104   °[facility] [address], [place], [place] 336-379-7146   °[facility] [address]  °[place], [place] 336-285-9073   °[facility] - Intensive Outpatient [address], [place], [place] 336-852-3033   °[facility] [address]  °[place], [place] 1-877-615-2722 or 336-784-9470   °[facility] [address] [place], [place] 336-227-7417 Accepts Medicaid  °[facility] [address]  °[place] 1-800-659-3381 Substance Abuse/Addiction Treatment  ° °[place] Behavioral Health Resources °Organization          Address  Phone  Notes  °[facility]  (888) 581-9988   °Julie Brannon, PhD [address] [place], [place]   (336) 349-5553 or (336) 951-0000   °[facility]   [address] °[place], [place] (336) 349-4454   °[facility] [address], [place], [place] (336) 342-8316 Insurance/Medicaid/sponsorship through [facility]  °[facility] [address]                                    [place], [place] (336) 342-8316 Therapy/tele-psych/case  °[facility] [address]  ° [place], [place] (336) 349-2233    °Dr. Arfeen  (336) 349-4544   °[facility] [place]  [employer] [place] [facility] 1) [address], [place] °2) [address], [place] °3)  [address], [place] (336) 349-3220 °(336) 342-7768 ° °(336) 342-8140   °[place] [facility] (336) 342-1394 or (336) 342-3537 (After Hours)    ° ° ° °

## 2014-07-23 NOTE — ED Provider Notes (Signed)
CSN: [id number]     Arrival date & time 07/08/14  1436 History   First MD Initiated Contact with Patient 07/08/14 1911     Chief Complaint  Patient presents with  . Chest Pain     (Consider location/radiation/quality/duration/timing/severity/associated sxs/prior Treatment) HPI   58yM with CP. Intermittent for several months. L anterior chest. Sometimes fells in L shoulder and neck. No appreciable exacerbating or relieving factors. Has had both at rest and with exertion. Episodes last from a few minutes up to several hours. Mild SOB. No cough. No fever or chills. No unusual leg pain or swelling. No n/v.   Past Medical History  Diagnosis Date  . Diabetes mellitus without complication   . Hypertension   . COPD (chronic obstructive pulmonary disease)   . Anemia   . Seizures   . Cancer     colon   Past Surgical History  Procedure Laterality Date  . Head surgery    . Excisional hemorrhoidectomy     Family History  Problem Relation Age of Onset  . Stroke Mother   . Cancer Father    History  Substance Use Topics  . Smoking status: Current Some Day Smoker -- 1.00 packs/day    Types: Cigarettes  . Smokeless tobacco: Never Used  . Alcohol Use: Yes     Comment: 5 days/week    Review of Systems  All systems reviewed and negative, other than as noted in HPI.   Allergies  Review of patient's allergies indicates no known allergies.  Home Medications   Prior to Admission medications   Medication Sig Start Date End Date Taking? Authorizing Provider  albuterol (PROVENTIL HFA;VENTOLIN HFA) 108 (90 BASE) MCG/ACT inhaler Inhale 2 puffs into the lungs every 6 (six) hours as needed for wheezing or shortness of breath (wheezing).    Yes Historical Provider, MD  ibuprofen (ADVIL,MOTRIN) 600 MG tablet Take 1 tablet (600 mg total) by mouth every 6 (six) hours as needed. Patient not taking: Reported on 07/08/2014 09/02/13   Brandt Loosen, MD  levETIRAcetam (KEPPRA) 500 MG tablet Take 500 mg  by mouth daily.     Historical Provider, MD  traMADol (ULTRAM) 50 MG tablet Take 1 tablet (50 mg total) by mouth every 6 (six) hours as needed. Patient not taking: Reported on 07/08/2014 09/02/13   Brandt Loosen, MD   BP 162/99 mmHg  Pulse 61  Temp(Src) 98.1 F (36.7 C) (Oral)  Resp 18  SpO2 99% Physical Exam  Constitutional: He appears well-developed and well-nourished. No distress.  HENT:  Head: Normocephalic and atraumatic.  Eyes: Conjunctivae are normal. Right eye exhibits no discharge. Left eye exhibits no discharge.  Neck: Neck supple.  Cardiovascular: Normal rate, regular rhythm and normal heart sounds.  Exam reveals no gallop and no friction rub.   No murmur heard. Pulmonary/Chest: Effort normal and breath sounds normal. No respiratory distress.  Abdominal: Soft. He exhibits no distension. There is no tenderness.  Musculoskeletal: He exhibits no edema or tenderness.  Lower extremities symmetric as compared to each other. No calf tenderness. Negative Homan's. No palpable cords.   Neurological: He is alert.  Skin: Skin is warm and dry.  Psychiatric: He has a normal mood and affect. His behavior is normal. Thought content normal.  Nursing note and vitals reviewed.   ED Course  Procedures (including critical care time) Labs Review Labs Reviewed  CBC - Abnormal; Notable for the following:    RBC 3.62 (*)    Hemoglobin 12.6 (*)  HCT 37.6 (*)    MCV 103.9 (*)    MCH 34.8 (*)    All other components within normal limits  BASIC METABOLIC PANEL - Abnormal; Notable for the following:    Glucose, Bld 146 (*)    All other components within normal limits  BRAIN NATRIURETIC PEPTIDE  TROPONIN I  I-STAT TROPOININ, ED    Imaging Review No results found.   EKG Interpretation   Date/Time:  Monday July 08 2014 14:42:45 EDT Ventricular Rate:  77 PR Interval:  145 QRS Duration: 101 QT Interval:  385 QTC Calculation: 436 R Axis:   67 Text Interpretation:  Sinus rhythm No  significant change since last  tracing Confirmed by Juleen ChinaKOHUT  MD, Sarabi Sockwell ([id number]) on 07/08/2014 8:50:40 PM      MDM   Final diagnoses:  Chest pain, unspecified chest pain type        Raeford RazorStephen Eladia Frame, MD 07/23/14 1320

## 2014-11-15 ENCOUNTER — Encounter (HOSPITAL_COMMUNITY): Payer: Self-pay | Admitting: Emergency Medicine

## 2014-11-15 ENCOUNTER — Emergency Department (HOSPITAL_COMMUNITY): Payer: Medicaid Other

## 2014-11-15 ENCOUNTER — Emergency Department (HOSPITAL_COMMUNITY)
Admission: EM | Admit: 2014-11-15 | Discharge: 2014-11-15 | Disposition: A | Discharge status: Home / Self Care | Payer: Medicaid Other | Attending: Emergency Medicine | Admitting: Emergency Medicine

## 2014-11-15 DIAGNOSIS — Z23 Encounter for immunization: Secondary | ICD-10-CM | POA: Insufficient documentation

## 2014-11-15 DIAGNOSIS — L918 Other hypertrophic disorders of the skin: Secondary | ICD-10-CM

## 2014-11-15 DIAGNOSIS — S6991XA Unspecified injury of right wrist, hand and finger(s), initial encounter: Secondary | ICD-10-CM

## 2014-11-15 DIAGNOSIS — Y990 Civilian activity done for income or pay: Secondary | ICD-10-CM | POA: Insufficient documentation

## 2014-11-15 DIAGNOSIS — Z862 Personal history of diseases of the blood and blood-forming organs and certain disorders involving the immune mechanism: Secondary | ICD-10-CM | POA: Insufficient documentation

## 2014-11-15 DIAGNOSIS — B354 Tinea corporis: Secondary | ICD-10-CM

## 2014-11-15 DIAGNOSIS — Y9389 Activity, other specified: Secondary | ICD-10-CM | POA: Insufficient documentation

## 2014-11-15 DIAGNOSIS — Z79899 Other long term (current) drug therapy: Secondary | ICD-10-CM | POA: Insufficient documentation

## 2014-11-15 DIAGNOSIS — W2209XA Striking against other stationary object, initial encounter: Secondary | ICD-10-CM | POA: Insufficient documentation

## 2014-11-15 DIAGNOSIS — I10 Essential (primary) hypertension: Secondary | ICD-10-CM | POA: Insufficient documentation

## 2014-11-15 DIAGNOSIS — E119 Type 2 diabetes mellitus without complications: Secondary | ICD-10-CM | POA: Insufficient documentation

## 2014-11-15 DIAGNOSIS — Z720 Tobacco use: Secondary | ICD-10-CM | POA: Insufficient documentation

## 2014-11-15 DIAGNOSIS — Z85038 Personal history of other malignant neoplasm of large intestine: Secondary | ICD-10-CM | POA: Insufficient documentation

## 2014-11-15 DIAGNOSIS — J449 Chronic obstructive pulmonary disease, unspecified: Secondary | ICD-10-CM | POA: Insufficient documentation

## 2014-11-15 DIAGNOSIS — Y9289 Other specified places as the place of occurrence of the external cause: Secondary | ICD-10-CM | POA: Insufficient documentation

## 2014-11-15 DIAGNOSIS — Q828 Other specified congenital malformations of skin: Secondary | ICD-10-CM | POA: Insufficient documentation

## 2014-11-15 DIAGNOSIS — G40909 Epilepsy, unspecified, not intractable, without status epilepticus: Secondary | ICD-10-CM | POA: Insufficient documentation

## 2014-11-15 MED ORDER — CLOTRIMAZOLE 1 % EX CREA: 1.0000 "application " | TOPICAL_CREAM | Freq: Two times a day (BID) | CUTANEOUS | Status: DC

## 2014-11-15 MED ORDER — TETANUS-DIPHTH-ACELL PERTUSSIS 5-2.5-18.5 LF-MCG/0.5 IM SUSP
0.5000 mL | Freq: Once | INTRAMUSCULAR | Status: AC
  Administered 2014-11-15: 0.5 mL via INTRAMUSCULAR
  Filled 2014-11-15: qty 0.5

## 2014-11-15 MED ORDER — ACETAMINOPHEN 325 MG PO TABS
650.0000 mg | ORAL_TABLET | Freq: Once | ORAL | Status: AC
  Administered 2014-11-15: 650 mg via ORAL
  Filled 2014-11-15: qty 2

## 2014-11-15 NOTE — Discharge Instructions (Signed)
-   Use clotrimazole twice a day on your tinea corporis for 3-4 weeks or until rash resolves - Wear finger splint until pain and swelling resolve. Keep finger clean. Use OTC tylenol for pain - Skin tags are benign skin growths. May go to PCP for removal if they irritate you - Establ[facility] primary care with [facility] - Return to ED with redness or rapid swelling of the finger, numbness and tingling of finger, or further worsening of symptoms

## 2014-11-15 NOTE — ED Notes (Signed)
Patient here with complaint of right ring finger injury, rash on medial thighs, and "a mole or something down there" (points towards groin). Explains that he was operating a piece of machinery today when a piece of wood rebounded striking his right ring finger. Since that time the finger has been extremely painful. Palpation of any part of hand induces pain. Abrasion noted to ring finger. Additionally reports a rash medial upper thighs, is concerned it may be poison ivy.

## 2014-11-15 NOTE — ED Provider Notes (Signed)
History  This chart was scribed for non-physician practitioner, Alveta Heimlich, PA-C,working with Lavera Guise, MD, by Karle Plumber, ED Scribe. This patient was seen in room TR03C/TR03C and the patient's care was started at 8:53 PM.  Chief Complaint  Patient presents with  . Hand Injury  . Rash   The history is provided by the patient and medical records. No language interpreter was used.    HPI Comments:  George Cruz is a 59 y.o. male who presents to the Emergency Department complaining of a right fourth finger injury that he sustained approximately 10 hours ago while at work. He states he was using a "dirt pounding" machine and was holding on to it and it "took off" and hit his finger. He continued to work for the rest of the day and came in once he got off. He reports associated swelling of the DIP and moderate, throbbing pain. He has not taken anything for pain. He denies modifying factors of the pain. He has a small abrasion present on the 4th digit. He denies numbness, tingling or weakness of the right hand or fingers, CP, SOB, abdominal pain, nausea or vomiting. He is unsure of his last tetanus. He also reports a mildly itching rash located to his right groin and waistband that appeared about 4 months ago. He has not done anything to treat the area. He denies modifying factors of the area. He denies bleeding, drainage, fever or chills.  He is also complaining of small "dark warts" over his neck and inner thigh. They are not painful, itchy or growing in size. No redness or drainage from the "warts".   Past Medical History  Diagnosis Date  . Diabetes mellitus without complication   . Hypertension   . COPD (chronic obstructive pulmonary disease)   . Anemia   . Seizures   . Cancer     colon   Past Surgical History  Procedure Laterality Date  . Head surgery    . Excisional hemorrhoidectomy     Family History  Problem Relation Age of Onset  . Stroke Mother   . Cancer Father     Social History  Substance Use Topics  . Smoking status: Current Some Day Smoker -- 1.00 packs/day    Types: Cigarettes  . Smokeless tobacco: Never Used  . Alcohol Use: Yes     Comment: 5 days/week    Review of Systems  Constitutional: Negative for fever and chills.  Respiratory: Negative for shortness of breath.   Cardiovascular: Negative for chest pain.  Gastrointestinal: Negative for nausea, vomiting and abdominal pain.  Musculoskeletal: Positive for joint swelling and arthralgias.  Skin: Positive for rash and wound.  Neurological: Negative for weakness, numbness and headaches.    Allergies  Review of patient's allergies indicates no known allergies.  Home Medications   Prior to Admission medications   Medication Sig Start Date End Date Taking? Authorizing Provider  albuterol (PROVENTIL HFA;VENTOLIN HFA) 108 (90 BASE) MCG/ACT inhaler Inhale 2 puffs into the lungs every 6 (six) hours as needed for wheezing or shortness of breath (wheezing).     Historical Provider, MD  clotrimazole (LOTRIMIN) 1 % cream Apply 1 application topically 2 (two) times daily. 11/15/14   Silvester Reierson, PA-C  ibuprofen (ADVIL,MOTRIN) 600 MG tablet Take 1 tablet (600 mg total) by mouth every 6 (six) hours as needed. Patient not taking: Reported on 07/08/2014 09/02/13   Brandt Loosen, MD  levETIRAcetam (KEPPRA) 500 MG tablet Take 500 mg by mouth daily.  Historical Provider, MD  traMADol (ULTRAM) 50 MG tablet Take 1 tablet (50 mg total) by mouth every 6 (six) hours as needed. Patient not taking: Reported on 07/08/2014 09/02/13   Brandt Loosen, MD   Triage Vitals: BP 129/79 mmHg  Pulse 76  Temp(Src) 98.6 F (37 C) (Oral)  Resp 18  SpO2 95% Physical Exam  Constitutional: He is oriented to person, place, and time. He appears well-developed and well-nourished. No distress.  HENT:  Head: Normocephalic and atraumatic.  Eyes: EOM are normal.  Neck: Normal range of motion.  Cardiovascular: Normal rate.    Cap refill < 3  Pulmonary/Chest: Effort normal.  Musculoskeletal: Normal range of motion.  Passive ROM of right fingers and wrist intact. Able to make a tight fist. Swelling of right 4th DIP.   Neurological: He is alert and oriented to person, place, and time.  5/5 grip strength of right hand. Sensation intact to fingers and fingertips of right hand.   Skin: Skin is warm and dry.  Small 1 cm skin shearing abrasion to right 4th DIP. No penetrating wound. No bleeding at wound site.   One large 7 cm and one smaller 4 cm erythematous annular lesions of inner right thigh. One 3 cm erythematous annular lesion at right abdomen at waistband. Lesions with scaling borders and central clearing. No drainage.   Multiple dark colored skin tags over neck. Large dark skin tag of inner left thigh.  Psychiatric: He has a normal mood and affect. His behavior is normal.  Nursing note and vitals reviewed.   ED Course  Procedures (including critical care time) DIAGNOSTIC STUDIES: Oxygen Saturation is 95% on RA, normal by my interpretation.   COORDINATION OF CARE: 9:34 PM- Informed pt of his negative X-Ray. Will prescribe Clotrimazole for treatment of ring worm. Will have nurse clean wound on right fourth finger and apply splint. Will update tetanus vaccination. Will provide resource guide for PCP follow up. Pt verbalizes understanding and agrees to plan.  Medications  Tdap (BOOSTRIX) injection 0.5 mL (0.5 mLs Intramuscular Given 11/15/14 2224)  acetaminophen (TYLENOL) tablet 650 mg (650 mg Oral Given 11/15/14 2223)    Labs Review Labs Reviewed - No data to display  Imaging Review Dg Hand Complete Right  11/15/2014   CLINICAL DATA:  Right ring finger injury while operating a piece of machinery today. Piece of wood struck the finger. Pain since then.  EXAM: RIGHT HAND - COMPLETE 3+ VIEW  COMPARISON:  None.  FINDINGS: Degenerative changes demonstrated in the distal interphalangeal joints and first  metacarpal phalangeal joint. No evidence of acute fracture or dislocation. No radiopaque soft tissue foreign bodies.  IMPRESSION: Degenerative changes. No acute bony abnormalities. No radiopaque soft tissue foreign bodies.   Electronically Signed   By: Burman Nieves M.D.   On: 11/15/2014 21:01   I have personally reviewed and evaluated these images and lab results as part of my medical decision-making.   EKG Interpretation None      MDM   Final diagnoses:  Tinea corporis  Finger injury, right, initial encounter  Skin tag   Pt presenting with multiple complaints. Today at work, a piece of machinery jammed his finger and sheared a small area of skin off. Mild swelling of right 4th digit DIP with minimal 0.5 cm skin shearing abrasion present. No bleeding from wound site. Finger is musculoskeletally and neurovascularly intact. Negative hand xray. Will splint digit until pain resolves. Not UTD on tetanus so will give TDaP as well. Pt  also complaining of 4 months of pruritic rash to right inner thigh and at right waist. Tinea corporis lesions present on exam; 2 to right inner thigh and 1 to right waistband. Will treat with clotrimazole cream BID for 3-4 weeks. Pt to follow up with primary care if no resolution of rash. Pt also complaining of skin tags. Told to follow up with primary care for removal if he wants. Pt does not have a PCP at the moment; will give [facility]. Pt given tylenol in ED for pain control with moderate relief. Pt given return precautions in discharge paperwork. Pt agrees with this plan.   I personally performed the services described in this documentation, which was scribed in my presence. The recorded information has been reviewed and is accurate.    Rolm Gala Gilmore List, PA-C 11/16/14 0141  Lavera Guise, MD 11/16/14 [phone]

## 2014-11-15 NOTE — Progress Notes (Signed)
Orthopedic Tech Progress Note Patient Details:  George Cruz 1955-03-31 [id number]  Ortho Devices Type of Ortho Device: Finger splint Ortho Device/Splint Location: RUE Ortho Device/Splint Interventions: Ordered, Application   Jennye Moccasin 11/15/2014, 10:13 PM

## 2014-12-24 ENCOUNTER — Emergency Department (HOSPITAL_COMMUNITY)
Admission: EM | Admit: 2014-12-24 | Discharge: 2014-12-24 | Discharge status: Against Medical Advice | Payer: Medicaid Other | Attending: Emergency Medicine | Admitting: Emergency Medicine

## 2014-12-24 DIAGNOSIS — E119 Type 2 diabetes mellitus without complications: Secondary | ICD-10-CM | POA: Insufficient documentation

## 2014-12-24 DIAGNOSIS — Z720 Tobacco use: Secondary | ICD-10-CM | POA: Insufficient documentation

## 2014-12-24 DIAGNOSIS — J449 Chronic obstructive pulmonary disease, unspecified: Secondary | ICD-10-CM | POA: Insufficient documentation

## 2014-12-24 DIAGNOSIS — F1010 Alcohol abuse, uncomplicated: Secondary | ICD-10-CM | POA: Insufficient documentation

## 2014-12-24 DIAGNOSIS — I10 Essential (primary) hypertension: Secondary | ICD-10-CM | POA: Insufficient documentation

## 2014-12-24 NOTE — ED Notes (Signed)
Pt left AMA before getting to his room and being triaged.

## 2015-07-03 ENCOUNTER — Emergency Department (HOSPITAL_COMMUNITY): Payer: Medicaid - Out of State

## 2015-07-03 ENCOUNTER — Encounter (HOSPITAL_COMMUNITY): Payer: Self-pay | Admitting: Emergency Medicine

## 2015-07-03 ENCOUNTER — Emergency Department (HOSPITAL_COMMUNITY)
Admission: EM | Admit: 2015-07-03 | Discharge: 2015-07-03 | Disposition: A | Discharge status: Home / Self Care | Payer: Medicaid - Out of State | Attending: Emergency Medicine | Admitting: Emergency Medicine

## 2015-07-03 DIAGNOSIS — R079 Chest pain, unspecified: Secondary | ICD-10-CM | POA: Diagnosis not present

## 2015-07-03 DIAGNOSIS — J449 Chronic obstructive pulmonary disease, unspecified: Secondary | ICD-10-CM | POA: Diagnosis not present

## 2015-07-03 DIAGNOSIS — E119 Type 2 diabetes mellitus without complications: Secondary | ICD-10-CM | POA: Insufficient documentation

## 2015-07-03 DIAGNOSIS — Z79899 Other long term (current) drug therapy: Secondary | ICD-10-CM | POA: Diagnosis not present

## 2015-07-03 DIAGNOSIS — Z85038 Personal history of other malignant neoplasm of large intestine: Secondary | ICD-10-CM | POA: Diagnosis not present

## 2015-07-03 DIAGNOSIS — Z862 Personal history of diseases of the blood and blood-forming organs and certain disorders involving the immune mechanism: Secondary | ICD-10-CM | POA: Insufficient documentation

## 2015-07-03 DIAGNOSIS — F17210 Nicotine dependence, cigarettes, uncomplicated: Secondary | ICD-10-CM | POA: Diagnosis not present

## 2015-07-03 DIAGNOSIS — I10 Essential (primary) hypertension: Secondary | ICD-10-CM | POA: Diagnosis not present

## 2015-07-03 LAB — CBC WITH DIFFERENTIAL/PLATELET
BASOS ABS: 0 10*3/uL (ref 0.0–0.1)
BASOS PCT: 1 %
Eosinophils Absolute: 0.4 10*3/uL (ref 0.0–0.7)
Eosinophils Relative: 5 %
HCT: 45.6 % (ref 39.0–52.0)
Hemoglobin: 16.1 g/dL (ref 13.0–17.0)
Lymphocytes Relative: 28 %
Lymphs Abs: 2.2 10*3/uL (ref 0.7–4.0)
MCH: 35.7 pg — ABNORMAL HIGH (ref 26.0–34.0)
MCHC: 35.3 g/dL (ref 30.0–36.0)
MCV: 101.1 fL — ABNORMAL HIGH (ref 78.0–100.0)
MONO ABS: 1 10*3/uL (ref 0.1–1.0)
MONOS PCT: 13 %
Neutro Abs: 4.2 10*3/uL (ref 1.7–7.7)
Neutrophils Relative %: 53 %
PLATELETS: 217 10*3/uL (ref 150–400)
RBC: 4.51 MIL/uL (ref 4.22–5.81)
RDW: 14.5 % (ref 11.5–15.5)
WBC: 7.8 10*3/uL (ref 4.0–10.5)

## 2015-07-03 LAB — I-STAT TROPONIN, ED: Troponin i, poc: 0 ng/mL (ref 0.00–0.08)

## 2015-07-03 LAB — BASIC METABOLIC PANEL
ANION GAP: 13 (ref 5–15)
BUN: 29 mg/dL — ABNORMAL HIGH (ref 6–20)
CO2: 20 mmol/L — ABNORMAL LOW (ref 22–32)
CREATININE: 1.43 mg/dL — AB (ref 0.61–1.24)
Calcium: 9.5 mg/dL (ref 8.9–10.3)
Chloride: 101 mmol/L (ref 101–111)
GFR, EST NON AFRICAN AMERICAN: 53 mL/min — AB (ref >60)
Glucose, Bld: 106 mg/dL — ABNORMAL HIGH (ref 65–99)
Potassium: 4.3 mmol/L (ref 3.5–5.1)
Sodium: 134 mmol/L — ABNORMAL LOW (ref 135–145)

## 2015-07-03 LAB — D-DIMER, QUANTITATIVE (NOT AT ARMC)

## 2015-07-03 MED ORDER — LEVETIRACETAM 500 MG PO TABS: 500.0000 mg | ORAL_TABLET | Freq: Every day | ORAL | Status: DC

## 2015-07-03 MED ORDER — TRAMADOL HCL 50 MG PO TABS: 50.0000 mg | ORAL_TABLET | Freq: Two times a day (BID) | ORAL | Status: DC | PRN

## 2015-07-03 MED ORDER — KETOROLAC TROMETHAMINE 30 MG/ML IJ SOLN
30.0000 mg | Freq: Once | INTRAMUSCULAR | Status: AC
  Administered 2015-07-03: 30 mg via INTRAVENOUS
  Filled 2015-07-03: qty 1

## 2015-07-03 MED ORDER — AMLODIPINE BESYLATE 10 MG PO TABS: 10.0000 mg | ORAL_TABLET | Freq: Every day | ORAL | Status: AC

## 2015-07-03 MED ORDER — ALBUTEROL SULFATE HFA 108 (90 BASE) MCG/ACT IN AERS
2.0000 | INHALATION_SPRAY | Freq: Once | RESPIRATORY_TRACT | Status: AC
  Administered 2015-07-03: 2 via RESPIRATORY_TRACT
  Filled 2015-07-03: qty 6.7

## 2015-07-03 MED ORDER — SODIUM CHLORIDE 0.9 % IV BOLUS (SEPSIS)
1000.0000 mL | Freq: Once | INTRAVENOUS | Status: AC
  Administered 2015-07-03: 1000 mL via INTRAVENOUS

## 2015-07-03 MED ORDER — MORPHINE SULFATE (PF) 4 MG/ML IV SOLN
6.0000 mg | Freq: Once | INTRAVENOUS | Status: AC
Start: 2015-07-03 — End: 2015-07-03
  Administered 2015-07-03: 6 mg via INTRAVENOUS
  Filled 2015-07-03: qty 2

## 2015-07-03 NOTE — ED Provider Notes (Signed)
CSN: [id number]     Arrival date & time 07/03/15  0431 History   First MD Initiated Contact with Patient 07/03/15 0458     Chief Complaint  Patient presents with  . Chest Pain     (Consider location/radiation/quality/duration/timing/severity/associated sxs/prior Treatment) HPI  George Cruz is a 60 y.o. male with past medical history of hypertension, diabetes, "silent heart attacks", presents today with chest pain. He states she's had left-sided chest pain for the past 2 days. This intermittently occurs for the past several months and he was told by [place] in [place] that he has silent heart attacks. He states the pain is sharp and extends from his left abdomen up his left ribs to his left chest. He states he has shortness of breath. He states the pain is worse anytime he moves. He denies nausea vomiting or diarrhea. He is unsure if this feels like his prior heart attacks. He denies any history of blood clots or risk factors for blood clots. He's had no pain or swelling in his legs. Nothing has made his symptoms better. Then no further complaints.  10 Systems reviewed and are negative for acute change except as noted in the HPI.      Past Medical History  Diagnosis Date  . Diabetes mellitus without complication (HCC)   . Hypertension   . COPD (chronic obstructive pulmonary disease) (HCC)   . Anemia   . Seizures (HCC)   . Cancer [facility](HCC)     colon   Past Surgical History  Procedure Laterality Date  . Head surgery    . Excisional hemorrhoidectomy     Family History  Problem Relation Age of Onset  . Stroke Mother   . Cancer Father    Social History  Substance Use Topics  . Smoking status: Current Some Day Smoker -- 1.00 packs/day    Types: Cigarettes  . Smokeless tobacco: Never Used  . Alcohol Use: Yes     Comment: 5 days/week    Review of Systems    Allergies  Review of patient's allergies indicates no known allergies.  Home Medications   Prior to Admission  medications   Medication Sig Start Date End Date Taking? Authorizing Provider  albuterol (PROVENTIL HFA;VENTOLIN HFA) 108 (90 BASE) MCG/ACT inhaler Inhale 2 puffs into the lungs every 6 (six) hours as needed for wheezing or shortness of breath (wheezing).     Historical Provider, MD  clotrimazole (LOTRIMIN) 1 % cream Apply 1 application topically 2 (two) times daily. 11/15/14   Stevi Barrett, PA-C  ibuprofen (ADVIL,MOTRIN) 600 MG tablet Take 1 tablet (600 mg total) by mouth every 6 (six) hours as needed. Patient not taking: Reported on 07/08/2014 09/02/13   Brandt LoosenJulie Manly, MD  levETIRAcetam (KEPPRA) 500 MG tablet Take 500 mg by mouth daily.     Historical Provider, MD  traMADol (ULTRAM) 50 MG tablet Take 1 tablet (50 mg total) by mouth every 6 (six) hours as needed. Patient not taking: Reported on 07/08/2014 09/02/13   Brandt LoosenJulie Manly, MD   BP 139/113 mmHg  Pulse 78  Temp(Src) 98.5 F (36.9 C) (Oral)  Resp 15  Ht 6\' 1"  (1.854 m)  Wt 240 lb (108.863 kg)  BMI 31.67 kg/m2  SpO2 95% Physical Exam  Constitutional: He is oriented to person, place, and time. Vital signs are normal. He appears well-developed and well-nourished.  Non-toxic appearance. He does not appear ill. No distress.  HENT:  Head: Normocephalic and atraumatic.  Nose: Nose normal.  Mouth/Throat: Oropharynx  is clear and moist. No oropharyngeal exudate.  Eyes: Conjunctivae and EOM are normal. Pupils are equal, round, and reactive to light. No scleral icterus.  Neck: Normal range of motion. Neck supple. No tracheal deviation, no edema, no erythema and normal range of motion present. No thyroid mass and no thyromegaly present.  Cardiovascular: Normal rate, regular rhythm, S1 normal, S2 normal, normal heart sounds, intact distal pulses and normal pulses.  Exam reveals no gallop and no friction rub.   No murmur heard. Pulmonary/Chest: Effort normal and breath sounds normal. No respiratory distress. He has no wheezes. He has no rhonchi. He has  no rales. He exhibits tenderness.  Left-sided chest wall tenderness to palpation.  Abdominal: Soft. Normal appearance and bowel sounds are normal. He exhibits no distension, no ascites and no mass. There is no hepatosplenomegaly. There is no tenderness. There is no rebound, no guarding and no CVA tenderness.  Musculoskeletal: Normal range of motion. He exhibits no edema or tenderness.  Lymphadenopathy:    He has no cervical adenopathy.  Neurological: He is alert and oriented to person, place, and time. He has normal strength. No cranial nerve deficit or sensory deficit.  Skin: Skin is warm, dry and intact. No petechiae and no rash noted. He is not diaphoretic. No erythema. No pallor.  Psychiatric: He has a normal mood and affect. His behavior is normal. Judgment normal.  Nursing note and vitals reviewed.   ED Course  Procedures (including critical care time) Labs Review Labs Reviewed  CBC WITH DIFFERENTIAL/PLATELET - Abnormal; Notable for the following:    MCV 101.1 (*)    MCH 35.7 (*)    All other components within normal limits  BASIC METABOLIC PANEL - Abnormal; Notable for the following:    Sodium 134 (*)    CO2 20 (*)    Glucose, Bld 106 (*)    BUN 29 (*)    Creatinine, Ser 1.43 (*)    GFR calc non Af Amer 53 (*)    All other components within normal limits  D-DIMER, QUANTITATIVE (NOT AT [facility])  Rosezena Sensor, ED    Imaging Review Dg Chest 2 View  07/03/2015  CLINICAL DATA:  Left lower chest pain for 2 days EXAM: CHEST  2 VIEW COMPARISON:  07/08/2014 FINDINGS: There is moderate aortic tortuosity and mild cardiomegaly, unchanged. The lungs are clear. The pulmonary vasculature is normal. There is no pleural effusion. IMPRESSION: Unchanged aortic tortuosity and mild cardiomegaly. No acute cardiopulmonary findings. Electronically Signed   By: Ellery Plunk M.D.   On: 07/03/2015 06:10   I have personally reviewed and evaluated these images and lab results as part of my  medical decision-making.   EKG Interpretation   Date/Time:  Thursday July 03 2015 04:43:43 EDT Ventricular Rate:  73 PR Interval:  146 QRS Duration: 100 QT Interval:  399 QTC Calculation: 440 R Axis:   58 Text Interpretation:  Sinus rhythm Atrial premature complex Abnormal  R-wave progression, early transition No significant change since last  tracing Confirmed by Erroll Luna [id number]) on 07/03/2015 5:06:53 AM      MDM   Final diagnoses:  None    Patient presents to the emergency department for chest pain. His history is not consistent with ACS, he is rather low risk. We'll obtain laboratory studies including troponin and d-dimer for evaluation. Heart score is less than 3. EKG does not show any signs of ischemia and is unchanged from his previous tracing. He was given Toradol and morphine  for pain control.  CXR reveals mild torutous aorta which is unchanged from previous.  I do not believe his history is consistent with aortic dissection either.  D dimer is negative.  HEART score is 3 for age and risk factors.  Pain has improved after medication.  Patient encouraged to see a PCP within 3 days.  He appears well and in NAD.  VS remain within his normal limits and he is safe for DC.  Tomasita Crumble, MD 07/03/15 [phone]

## 2015-07-03 NOTE — Discharge Instructions (Signed)
Chest Wall Pain George Cruz, see a primary care doctor within 3 days for close follow up.  Take tylenol or ibuprofen as needed for pain. If symptoms worsen, come back to the ED immediately. Thank you. Chest wall pain is pain in or around the bones and muscles of your chest. Sometimes, an injury causes this pain. Sometimes, the cause may not be known. This pain may take several weeks or longer to get better. HOME CARE Pay attention to any changes in your symptoms. Take these actions to help with your pain:  Rest as told by your doctor.  Avoid activities that cause pain. Try not to use your chest, belly (abdominal), or side muscles to lift heavy things.  If directed, apply ice to the painful area:  Put ice in a plastic bag.  Place a towel between your skin and the bag.  Leave the ice on for 20 minutes, 2-3 times per day.  Take over-the-counter and prescription medicines only as told by your doctor.  Do not use tobacco products, including cigarettes, chewing tobacco, and e-cigarettes. If you need help quitting, ask your doctor.  Keep all follow-up visits as told by your doctor. This is important. GET HELP IF:  You have a fever.  Your chest pain gets worse.  You have new symptoms. GET HELP RIGHT AWAY IF:  You feel sick to your stomach (nauseous) or you throw up (vomit).  You feel sweaty or light-headed.  You have a cough with phlegm (sputum) or you cough up blood.  You are short of breath.   This information is not intended to replace advice given to you by your health care provider. Make sure you discuss any questions you have with your health care provider.   Document Released: 08/25/2007 Document Revised: 11/27/2014 Document Reviewed: 06/03/2014 Elsevier Interactive Patient Education [employer]2016 [employer]

## 2015-07-03 NOTE — ED Notes (Signed)
[facility] with c/o chest pain in left ribs for 2 days. Pt states it has been getting worse, he was at work when it started. Hx of 2 MIs, asthma, HTN. Pt received 324 aspirin, 20g in left wrist. Last vitals 130/100, HR 80, 97% RA, CBG 93. 12 Lead unremarkable.

## 2015-12-17 ENCOUNTER — Encounter (HOSPITAL_COMMUNITY): Payer: Self-pay

## 2015-12-17 ENCOUNTER — Inpatient Hospital Stay (HOSPITAL_COMMUNITY)
Admission: EM | Admit: 2015-12-17 | Discharge: 2015-12-24 | DRG: 871 | Disposition: A | Discharge status: Home Health | Payer: Self-pay | Attending: Internal Medicine | Admitting: Internal Medicine

## 2015-12-17 DIAGNOSIS — N179 Acute kidney failure, unspecified: Secondary | ICD-10-CM | POA: Diagnosis present

## 2015-12-17 DIAGNOSIS — R079 Chest pain, unspecified: Secondary | ICD-10-CM | POA: Diagnosis present

## 2015-12-17 DIAGNOSIS — J449 Chronic obstructive pulmonary disease, unspecified: Secondary | ICD-10-CM | POA: Diagnosis present

## 2015-12-17 DIAGNOSIS — R7989 Other specified abnormal findings of blood chemistry: Secondary | ICD-10-CM | POA: Diagnosis present

## 2015-12-17 DIAGNOSIS — R197 Diarrhea, unspecified: Secondary | ICD-10-CM | POA: Diagnosis present

## 2015-12-17 DIAGNOSIS — R778 Other specified abnormalities of plasma proteins: Secondary | ICD-10-CM | POA: Diagnosis present

## 2015-12-17 DIAGNOSIS — I10 Essential (primary) hypertension: Secondary | ICD-10-CM | POA: Diagnosis present

## 2015-12-17 DIAGNOSIS — J440 Chronic obstructive pulmonary disease with acute lower respiratory infection: Secondary | ICD-10-CM | POA: Diagnosis present

## 2015-12-17 DIAGNOSIS — Z9119 Patient's noncompliance with other medical treatment and regimen: Secondary | ICD-10-CM

## 2015-12-17 DIAGNOSIS — G40909 Epilepsy, unspecified, not intractable, without status epilepticus: Secondary | ICD-10-CM | POA: Diagnosis present

## 2015-12-17 DIAGNOSIS — R0600 Dyspnea, unspecified: Secondary | ICD-10-CM

## 2015-12-17 DIAGNOSIS — E860 Dehydration: Secondary | ICD-10-CM

## 2015-12-17 DIAGNOSIS — K5730 Diverticulosis of large intestine without perforation or abscess without bleeding: Secondary | ICD-10-CM | POA: Diagnosis present

## 2015-12-17 DIAGNOSIS — E872 Acidosis, unspecified: Secondary | ICD-10-CM | POA: Diagnosis present

## 2015-12-17 DIAGNOSIS — E876 Hypokalemia: Secondary | ICD-10-CM | POA: Diagnosis not present

## 2015-12-17 DIAGNOSIS — E871 Hypo-osmolality and hyponatremia: Secondary | ICD-10-CM | POA: Diagnosis present

## 2015-12-17 DIAGNOSIS — E119 Type 2 diabetes mellitus without complications: Secondary | ICD-10-CM | POA: Diagnosis present

## 2015-12-17 DIAGNOSIS — F17210 Nicotine dependence, cigarettes, uncomplicated: Secondary | ICD-10-CM | POA: Diagnosis present

## 2015-12-17 DIAGNOSIS — K625 Hemorrhage of anus and rectum: Secondary | ICD-10-CM

## 2015-12-17 DIAGNOSIS — J189 Pneumonia, unspecified organism: Secondary | ICD-10-CM | POA: Diagnosis present

## 2015-12-17 DIAGNOSIS — A419 Sepsis, unspecified organism: Principal | ICD-10-CM | POA: Diagnosis present

## 2015-12-17 LAB — CBC
HEMATOCRIT: 43.9 % (ref 39.0–52.0)
HEMOGLOBIN: 15.6 g/dL (ref 13.0–17.0)
MCH: 34.7 pg — ABNORMAL HIGH (ref 26.0–34.0)
MCHC: 35.5 g/dL (ref 30.0–36.0)
MCV: 97.8 fL (ref 78.0–100.0)
Platelets: 192 10*3/uL (ref 150–400)
RBC: 4.49 MIL/uL (ref 4.22–5.81)
RDW: 12.8 % (ref 11.5–15.5)
WBC: 12.4 10*3/uL — AB (ref 4.0–10.5)

## 2015-12-17 LAB — BASIC METABOLIC PANEL
ANION GAP: 15 (ref 5–15)
BUN: 61 mg/dL — ABNORMAL HIGH (ref 6–20)
CHLORIDE: 95 mmol/L — AB (ref 101–111)
CO2: 14 mmol/L — ABNORMAL LOW (ref 22–32)
Calcium: 9.3 mg/dL (ref 8.9–10.3)
Creatinine, Ser: 3.43 mg/dL — ABNORMAL HIGH (ref 0.61–1.24)
GFR, EST AFRICAN AMERICAN: 21 mL/min — AB (ref >60)
GFR, EST NON AFRICAN AMERICAN: 18 mL/min — AB (ref >60)
Glucose, Bld: 134 mg/dL — ABNORMAL HIGH (ref 65–99)
POTASSIUM: 3.6 mmol/L (ref 3.5–5.1)
SODIUM: 124 mmol/L — AB (ref 135–145)

## 2015-12-17 NOTE — ED Triage Notes (Signed)
Pt states that he has not at or drank anything for the past six days, pt states that anything he tries to eat he starts to have diarrhea. Also c/o dark colored urine and SOB, hx of COPD, pt reports SOB has been going on for months.

## 2015-12-18 ENCOUNTER — Inpatient Hospital Stay (HOSPITAL_COMMUNITY): Payer: Self-pay

## 2015-12-18 ENCOUNTER — Emergency Department (HOSPITAL_COMMUNITY): Payer: Self-pay

## 2015-12-18 ENCOUNTER — Encounter (HOSPITAL_COMMUNITY): Payer: Self-pay | Admitting: Family Medicine

## 2015-12-18 DIAGNOSIS — E871 Hypo-osmolality and hyponatremia: Secondary | ICD-10-CM | POA: Diagnosis present

## 2015-12-18 DIAGNOSIS — J449 Chronic obstructive pulmonary disease, unspecified: Secondary | ICD-10-CM | POA: Diagnosis present

## 2015-12-18 DIAGNOSIS — R7989 Other specified abnormal findings of blood chemistry: Secondary | ICD-10-CM

## 2015-12-18 DIAGNOSIS — I10 Essential (primary) hypertension: Secondary | ICD-10-CM | POA: Diagnosis present

## 2015-12-18 DIAGNOSIS — N179 Acute kidney failure, unspecified: Secondary | ICD-10-CM | POA: Diagnosis present

## 2015-12-18 DIAGNOSIS — R079 Chest pain, unspecified: Secondary | ICD-10-CM | POA: Diagnosis present

## 2015-12-18 DIAGNOSIS — J189 Pneumonia, unspecified organism: Secondary | ICD-10-CM | POA: Diagnosis present

## 2015-12-18 DIAGNOSIS — R778 Other specified abnormalities of plasma proteins: Secondary | ICD-10-CM | POA: Diagnosis present

## 2015-12-18 DIAGNOSIS — E872 Acidosis, unspecified: Secondary | ICD-10-CM | POA: Diagnosis present

## 2015-12-18 DIAGNOSIS — R197 Diarrhea, unspecified: Secondary | ICD-10-CM | POA: Diagnosis present

## 2015-12-18 LAB — COMPREHENSIVE METABOLIC PANEL
ALBUMIN: 3.1 g/dL — AB (ref 3.5–5.0)
ALK PHOS: 62 U/L (ref 38–126)
ALT: 91 U/L — AB (ref 17–63)
AST: 142 U/L — AB (ref 15–41)
Anion gap: 9 (ref 5–15)
BILIRUBIN TOTAL: 0.9 mg/dL (ref 0.3–1.2)
BUN: 45 mg/dL — AB (ref 6–20)
CALCIUM: 8.4 mg/dL — AB (ref 8.9–10.3)
CO2: 17 mmol/L — ABNORMAL LOW (ref 22–32)
CREATININE: 1.83 mg/dL — AB (ref 0.61–1.24)
Chloride: 98 mmol/L — ABNORMAL LOW (ref 101–111)
GFR calc Af Amer: 45 mL/min — ABNORMAL LOW (ref >60)
GFR, EST NON AFRICAN AMERICAN: 39 mL/min — AB (ref >60)
GLUCOSE: 124 mg/dL — AB (ref 65–99)
POTASSIUM: 3.4 mmol/L — AB (ref 3.5–5.1)
Sodium: 124 mmol/L — ABNORMAL LOW (ref 135–145)
TOTAL PROTEIN: 7.1 g/dL (ref 6.5–8.1)

## 2015-12-18 LAB — URINALYSIS, ROUTINE W REFLEX MICROSCOPIC
BILIRUBIN URINE: NEGATIVE
Glucose, UA: NEGATIVE mg/dL
KETONES UR: NEGATIVE mg/dL
Leukocytes, UA: NEGATIVE
NITRITE: NEGATIVE
PH: 5.5 (ref 5.0–8.0)
Protein, ur: 100 mg/dL — AB
Specific Gravity, Urine: 1.025 (ref 1.005–1.030)

## 2015-12-18 LAB — SODIUM, URINE, RANDOM

## 2015-12-18 LAB — CK TOTAL AND CKMB (NOT AT ARMC)
CK TOTAL: 1256 U/L — AB (ref 49–397)
CK TOTAL: 708 U/L — AB (ref 49–397)
CK, MB: 31.8 ng/mL — AB (ref 0.5–5.0)
CK, MB: 18.7 ng/mL — AB (ref 0.5–5.0)
CK, MB: 11.4 ng/mL — ABNORMAL HIGH (ref 0.5–5.0)
RELATIVE INDEX: 2.5 (ref 0.0–2.5)
RELATIVE INDEX: 2 (ref 0.0–2.5)
Relative Index: 1.6 (ref 0.0–2.5)
Total CK: 942 U/L — ABNORMAL HIGH (ref 49–397)

## 2015-12-18 LAB — GASTROINTESTINAL PANEL BY PCR, STOOL (REPLACES STOOL CULTURE)

## 2015-12-18 LAB — C DIFFICILE QUICK SCREEN W PCR REFLEX
C Diff antigen: NEGATIVE
C Diff interpretation: NOT DETECTED
C Diff toxin: NEGATIVE

## 2015-12-18 LAB — URINE MICROSCOPIC-ADD ON: WBC, UA: NONE SEEN WBC/hpf (ref 0–5)

## 2015-12-18 LAB — CBC WITH DIFFERENTIAL/PLATELET
BASOS ABS: 0 10*3/uL (ref 0.0–0.1)
Basophils Relative: 0 %
EOS ABS: 0 10*3/uL (ref 0.0–0.7)
EOS PCT: 0 %
HCT: 40.8 % (ref 39.0–52.0)
Hemoglobin: 14.4 g/dL (ref 13.0–17.0)
LYMPHS PCT: 9 %
Lymphs Abs: 0.8 10*3/uL (ref 0.7–4.0)
MCH: 34.4 pg — ABNORMAL HIGH (ref 26.0–34.0)
MCHC: 35.3 g/dL (ref 30.0–36.0)
MCV: 97.6 fL (ref 78.0–100.0)
MONO ABS: 1.3 10*3/uL — AB (ref 0.1–1.0)
Monocytes Relative: 14 %
Neutro Abs: 7.1 10*3/uL (ref 1.7–7.7)
Neutrophils Relative %: 77 %
PLATELETS: 160 10*3/uL (ref 150–400)
RBC: 4.18 MIL/uL — AB (ref 4.22–5.81)
RDW: 12.9 % (ref 11.5–15.5)
WBC: 9.2 10*3/uL (ref 4.0–10.5)

## 2015-12-18 LAB — BRAIN NATRIURETIC PEPTIDE: B NATRIURETIC PEPTIDE 5: 24.3 pg/mL (ref 0.0–100.0)

## 2015-12-18 LAB — TROPONIN I
Troponin I: 0.08 ng/mL (ref <0.03)
Troponin I: <0.03 ng/mL (ref <0.03)
Troponin I: 0.04 ng/mL (ref <0.03)
Troponin I: 0.06 ng/mL (ref <0.03)

## 2015-12-18 LAB — PROTEIN / CREATININE RATIO, URINE
CREATININE, URINE: 160.61 mg/dL
Protein Creatinine Ratio: 0.69 mg/mg{Cre} — ABNORMAL HIGH (ref 0.00–0.15)
TOTAL PROTEIN, URINE: 111 mg/dL

## 2015-12-18 LAB — MRSA PCR SCREENING: MRSA BY PCR: NEGATIVE

## 2015-12-18 LAB — POC OCCULT BLOOD, ED: FECAL OCCULT BLD: POSITIVE — AB

## 2015-12-18 LAB — OSMOLALITY: Osmolality: 275 mOsm/kg (ref 275–295)

## 2015-12-18 LAB — I-STAT CG4 LACTIC ACID, ED: LACTIC ACID, VENOUS: 0.86 mmol/L (ref 0.5–1.9)

## 2015-12-18 LAB — STREP PNEUMONIAE URINARY ANTIGEN: Strep Pneumo Urinary Antigen: NEGATIVE

## 2015-12-18 LAB — CBG MONITORING, ED: Glucose-Capillary: 128 mg/dL — ABNORMAL HIGH (ref 65–99)

## 2015-12-18 MED ORDER — SODIUM CHLORIDE 0.9 % IV SOLN
Freq: Once | INTRAVENOUS | Status: AC
  Administered 2015-12-18: 1000 mL via INTRAVENOUS

## 2015-12-18 MED ORDER — ACETAMINOPHEN 650 MG RE SUPP: 650.0000 mg | Freq: Four times a day (QID) | RECTAL | Status: DC | PRN

## 2015-12-18 MED ORDER — DEXTROSE 5 % IV SOLN
1.0000 g | INTRAVENOUS | Status: DC
  Administered 2015-12-19 – 2015-12-21 (×3): 1 g via INTRAVENOUS
  Filled 2015-12-18 (×3): qty 10

## 2015-12-18 MED ORDER — AZITHROMYCIN 500 MG IV SOLR
500.0000 mg | Freq: Once | INTRAVENOUS | Status: AC
  Administered 2015-12-18: 500 mg via INTRAVENOUS
  Filled 2015-12-18 (×2): qty 500

## 2015-12-18 MED ORDER — ENOXAPARIN SODIUM 30 MG/0.3ML ~~LOC~~ SOLN
30.0000 mg | SUBCUTANEOUS | Status: DC
  Administered 2015-12-18: 30 mg via SUBCUTANEOUS
  Filled 2015-12-18 (×2): qty 0.3

## 2015-12-18 MED ORDER — SODIUM CHLORIDE 0.9 % IV SOLN: INTRAVENOUS | Status: DC

## 2015-12-18 MED ORDER — ONDANSETRON HCL 4 MG/2ML IJ SOLN: 4.0000 mg | Freq: Four times a day (QID) | INTRAMUSCULAR | Status: DC | PRN

## 2015-12-18 MED ORDER — ONDANSETRON HCL 4 MG/2ML IJ SOLN
4.0000 mg | Freq: Once | INTRAMUSCULAR | Status: AC
  Administered 2015-12-18: 4 mg via INTRAVENOUS
  Filled 2015-12-18: qty 2

## 2015-12-18 MED ORDER — ONDANSETRON HCL 4 MG PO TABS: 4.0000 mg | ORAL_TABLET | Freq: Four times a day (QID) | ORAL | Status: DC | PRN

## 2015-12-18 MED ORDER — SODIUM CHLORIDE 0.9 % IV BOLUS (SEPSIS)
1000.0000 mL | Freq: Once | INTRAVENOUS | Status: AC
  Administered 2015-12-18: 1000 mL via INTRAVENOUS

## 2015-12-18 MED ORDER — DEXTROSE 5 % IV SOLN
1.0000 g | Freq: Once | INTRAVENOUS | Status: AC
  Administered 2015-12-18: 1 g via INTRAVENOUS
  Filled 2015-12-18: qty 10

## 2015-12-18 MED ORDER — ACETAMINOPHEN 325 MG PO TABS
650.0000 mg | ORAL_TABLET | Freq: Four times a day (QID) | ORAL | Status: DC | PRN
  Administered 2015-12-18 – 2015-12-22 (×3): 650 mg via ORAL
  Filled 2015-12-18 (×3): qty 2

## 2015-12-18 MED ORDER — LEVETIRACETAM ER 500 MG PO TB24
500.0000 mg | ORAL_TABLET | Freq: Every day | ORAL | Status: DC
Start: 2015-12-18 — End: 2015-12-24
  Administered 2015-12-18 – 2015-12-24 (×7): 500 mg via ORAL
  Filled 2015-12-18 (×7): qty 1

## 2015-12-18 MED ORDER — IOPAMIDOL (ISOVUE-300) INJECTION 61%
INTRAVENOUS | Status: AC
  Filled 2015-12-18: qty 30

## 2015-12-18 MED ORDER — AZITHROMYCIN 500 MG PO TABS
500.0000 mg | ORAL_TABLET | ORAL | Status: DC
  Administered 2015-12-19 – 2015-12-24 (×6): 500 mg via ORAL
  Filled 2015-12-18 (×6): qty 1

## 2015-12-18 MED ORDER — ALBUTEROL SULFATE (2.5 MG/3ML) 0.083% IN NEBU: 2.5000 mg | INHALATION_SOLUTION | RESPIRATORY_TRACT | Status: DC | PRN

## 2015-12-18 NOTE — Progress Notes (Signed)
PROGRESS NOTE    George Cruz  BJY:[id number] DOB: 12/19/1955 DOA: 12/17/2015 PCP: No PCP Per Patient    Brief Narrative: Acute kidney injury associated with diarrhea and complicated by hyponatremia and dyspnea. 60 year old male who presents with a chief complaint of malaise, diarrhea for last 2 weeks, decrease exercise functional capacity  Assessment & Plan:   Principal Problem:   AKI (acute kidney injury) (HCC) Active Problems:   Hyponatremia   Metabolic acidosis   Elevated troponin   Diarrhea   CAP (community acquired pneumonia)   Essential hypertension   COPD (chronic obstructive pulmonary disease) (HCC)   Chest pain   1. AKI. Patient with worsening dyspnea had to stop on IV fluids, old records personally reviewed, in the past has been at 3 and 1,6 cr. Will follow restrictive IV fluids strategy to avoid volume overload. K at 3,4.   2, Community acquired pneumonia. Chest film personally reviewed noted bibasilar infiltrates and possible right upper lobe, will continue antibiotic therapy with ceftriaxone and azithromycin, continue oxymetry monitor and supplemental 02 per [place].   3, Diarrhea. Will continue supportive care, will hold on IV fluids for now to prevent volume overload.   4, Hyponatremia. Will continue to follow renal panel in am.  5. Elevated troponin. Will continue to trend serial troponin, ekg personally reviewed positive pac with poor r wave progression.   6. Seizures.   DVT prophylaxis: lovenox Code Status: full  Family Communication: no family at bedside Disposition Plan: home    Consultants:     Procedures:    Antimicrobials:  Ceftriaxone azithromycin   Subjective: Patient with significant dyspnea, worse with exertion, associated with chest pain, No nausea or vomiting,. Positive malaise.   Objective: Vitals:   12/18/15 1215 12/18/15 1329 12/18/15 1330 12/18/15 1642  BP:   (!) 143/92   Pulse: 74  78 85  Resp: (!) 27  (!) 22 20  Temp:  (!)  100.5 F (38.1 C)  (!) 102.6 F (39.2 C)  TempSrc:  Oral  Oral  SpO2: 96%  97% 96%  Weight:   85.1 kg (187 lb 9.8 oz)   Height:  6\' 1"  (1.854 m) 6\' 1"  (1.854 m)     Intake/Output Summary (Last 24 hours) at 12/18/15 1800 Last data filed at 12/18/15 0865  Gross per 24 hour  Intake             1050 ml  Output              180 ml  Net              870 ml   Filed Weights   12/18/15 1330  Weight: 85.1 kg (187 lb 9.8 oz)    Examination:  General exam: deconditioned, dyspneic and ill looking appearing E ENT: mild conjunctival pallor, oral mucosa dry.  Respiratory system: decreased breath sounds at bases, positive dyspnea, no wheezing or rhonchi. Cardiovascular system: S1 & S2 heard, tachycardic, RRR. No JVD, murmurs, rubs, gallops or clicks. No pedal edema. Gastrointestinal system: Abdomen is nondistended, soft and nontender. No organomegaly or masses felt. Normal bowel sounds heard. Central nervous system: Alert and oriented. No focal neurological deficits. Extremities: Symmetric 5 x 5 power. Skin: No rashes, lesions or ulcers.     Data Reviewed: I have personally reviewed following labs and imaging studies  CBC:  Recent Labs Lab 12/17/15 2141 12/18/15 1451  WBC 12.4* 9.2  NEUTROABS  --  7.1  HGB 15.6 14.4  HCT 43.9 40.8  MCV 97.8 97.6  PLT 192 160   Basic Metabolic Panel:  Recent Labs Lab 12/17/15 2141 12/18/15 1451  NA 124* 124*  K 3.6 3.4*  CL 95* 98*  CO2 14* 17*  GLUCOSE 134* 124*  BUN 61* 45*  CREATININE 3.43* 1.83*  CALCIUM 9.3 8.4*   GFR: Estimated Creatinine Clearance: 49.1 mL/min (by C-G formula based on SCr of 1.83 mg/dL (H)). Liver Function Tests:  Recent Labs Lab 12/18/15 1451  AST 142*  ALT 91*  ALKPHOS 62  BILITOT 0.9  PROT 7.1  ALBUMIN 3.1*   No results for input(s): LIPASE, AMYLASE in the last 168 hours. No results for input(s): AMMONIA in the last 168 hours. Coagulation Profile: No results for input(s): INR, PROTIME in the  last 168 hours. Cardiac Enzymes:  Recent Labs Lab 12/18/15 0026 12/18/15 0928 12/18/15 1451  CKTOTAL  --  1,256* 942*  CKMB  --  31.8* 18.7*  TROPONINI 0.08* <0.03 0.04*  0.06*   BNP (last 3 results) No results for input(s): PROBNP in the last 8760 hours. HbA1C: No results for input(s): HGBA1C in the last 72 hours. CBG:  Recent Labs Lab 12/18/15 0047  GLUCAP 128*   Lipid Profile: No results for input(s): CHOL, HDL, LDLCALC, TRIG, CHOLHDL, LDLDIRECT in the last 72 hours. Thyroid Function Tests: No results for input(s): TSH, T4TOTAL, FREET4, T3FREE, THYROIDAB in the last 72 hours. Anemia Panel: No results for input(s): VITAMINB12, FOLATE, FERRITIN, TIBC, IRON, RETICCTPCT in the last 72 hours. Sepsis Labs:  Recent Labs Lab 12/18/15 0056  LATICACIDVEN 0.86    Recent Results (from the past 240 hour(s))  C difficile quick scan w PCR reflex     Status: None   Collection Time: 12/18/15  2:05 AM  Result Value Ref Range Status   C Diff antigen NEGATIVE NEGATIVE Final   C Diff toxin NEGATIVE NEGATIVE Final   C Diff interpretation No C. difficile detected.  Final  Gastrointestinal Panel by PCR , Stool     Status: None   Collection Time: 12/18/15  2:05 AM  Result Value Ref Range Status   Campylobacter species NOT DETECTED NOT DETECTED Final   Plesimonas shigelloides NOT DETECTED NOT DETECTED Final   Salmonella species NOT DETECTED NOT DETECTED Final   Yersinia enterocolitica NOT DETECTED NOT DETECTED Final   Vibrio species NOT DETECTED NOT DETECTED Final   Vibrio cholerae NOT DETECTED NOT DETECTED Final   Enteroaggregative E coli (EAEC) NOT DETECTED NOT DETECTED Final   Enteropathogenic E coli (EPEC) NOT DETECTED NOT DETECTED Final   Enterotoxigenic E coli (ETEC) NOT DETECTED NOT DETECTED Final   Shiga like toxin producing E coli (STEC) NOT DETECTED NOT DETECTED Final   E. coli O157 NOT DETECTED NOT DETECTED Final   Shigella/Enteroinvasive E coli (EIEC) NOT DETECTED NOT  DETECTED Final   Cryptosporidium NOT DETECTED NOT DETECTED Final   Cyclospora cayetanensis NOT DETECTED NOT DETECTED Final   Entamoeba histolytica NOT DETECTED NOT DETECTED Final   Giardia lamblia NOT DETECTED NOT DETECTED Final   Adenovirus F40/41 NOT DETECTED NOT DETECTED Final   Astrovirus NOT DETECTED NOT DETECTED Final   Norovirus GI/GII NOT DETECTED NOT DETECTED Final   Rotavirus A NOT DETECTED NOT DETECTED Final   Sapovirus (I, II, IV, and V) NOT DETECTED NOT DETECTED Final  MRSA PCR Screening     Status: None   Collection Time: 12/18/15  1:25 PM  Result Value Ref Range Status   MRSA by PCR NEGATIVE NEGATIVE Final  Comment:        The GeneXpert MRSA Assay (FDA approved for NASAL specimens only), is one component of a comprehensive MRSA colonization surveillance program. It is not intended to diagnose MRSA infection nor to guide or monitor treatment for MRSA infections.          Radiology Studies: Ct Abdomen Pelvis Wo Contrast  Result Date: 12/18/2015 CLINICAL DATA:  Acute onset of nausea, vomiting and diarrhea. Generalized abdominal pain and gross hematuria. Initial encounter. EXAM: CT ABDOMEN AND PELVIS WITHOUT CONTRAST TECHNIQUE: Multidetector CT imaging of the abdomen and pelvis was performed following the standard protocol without IV contrast. COMPARISON:  None. FINDINGS: Lower chest: Right basilar airspace opacity raises concern for pneumonia. The visualized portions of the mediastinum are unremarkable. Hepatobiliary: The liver is unremarkable in appearance. The gallbladder is unremarkable in appearance. The common bile duct remains normal in caliber. Pancreas: The pancreas is within normal limits. Spleen: The spleen is unremarkable in appearance. Adrenals/Urinary Tract: The adrenal glands are unremarkable in appearance. A tiny nonobstructing 3 mm stone is noted near the upper pole of the left kidney, and a 3 mm stone is noted at the lower pole of the right kidney. The  kidneys are otherwise unremarkable. There is no evidence of hydronephrosis. No obstructing ureteral stones are identified. Mild nonspecific perinephric stranding is noted bilaterally. Stomach/Bowel: The stomach is unremarkable in appearance. The small bowel is within normal limits. The appendix is normal in caliber, without evidence of appendicitis. Mild diverticulosis is noted at the proximal sigmoid colon, without evidence of diverticulitis. Vascular/Lymphatic: Minimal calcification is noted along the abdominal aorta. Reproductive: Anterior wall thickening along the bladder is nonspecific. Would correlate for any evidence of cystitis, and consider cystoscopy to exclude underlying mass. The prostate remains normal in size, with minimal calcification. Other: No additional soft tissue abnormalities are seen. Musculoskeletal: No acute osseous abnormalities are identified. There is minimal grade 1 retrolisthesis of L1 on L2, of L2 on L3 and of L3 on L4, and grade 2 anterolisthesis of L5 on S1. Chronic bilateral pars defects are noted at L5. The visualized musculature is unremarkable in appearance. IMPRESSION: 1. Right basilar airspace opacity is concerning for pneumonia. 2. Anterior bladder wall thickening is nonspecific. Would correlate for any evidence of cystitis, and consider cystoscopy to exclude underlying mass. 3. Nonobstructing small bilateral renal stones seen, measuring up to 3 mm in size. No evidence of hydronephrosis. 4. Mild diverticulosis at the proximal sigmoid colon, without evidence of diverticulitis. 5. Chronic bilateral pars defects at L5, with grade 2 anterolisthesis of L5 on S1. Electronically Signed   By: Roanna Raider M.D.   On: 12/18/2015 03:25   Dg Chest 1 View  Result Date: 12/18/2015 CLINICAL DATA:  Cough, dyspnea EXAM: CHEST 1 VIEW COMPARISON:  12/18/2015 FINDINGS: Cardiomediastinal silhouette is stable. No acute infiltrate or pleural effusion. No pulmonary edema. Mild elevation of the  left hemidiaphragm again noted. IMPRESSION: No active disease. Electronically Signed   By: Natasha Mead M.D.   On: 12/18/2015 13:42   Dg Chest 2 View  Result Date: 12/18/2015 CLINICAL DATA:  60 year old male with shortness of breath. EXAM: CHEST  2 VIEW COMPARISON:  Chest radiograph dated 07/03/2015 FINDINGS: The heart size and mediastinal contours are within normal limits. Both lungs are clear. The visualized skeletal structures are unremarkable. IMPRESSION: No active cardiopulmonary disease. Electronically Signed   By: Elgie Collard M.D.   On: 12/18/2015 02:19        Scheduled Meds: . [START ON  12/19/2015] azithromycin  500 mg Oral Q24H  . [START ON 12/19/2015] cefTRIAXone (ROCEPHIN)  IV  1 g Intravenous Q24H  . enoxaparin (LOVENOX) injection  30 mg Subcutaneous Q24H   Continuous Infusions:    LOS: 0 days       Luismario Coston Annett Gula, MD [facility] Pager [phone]  If 7PM-7AM, please contact night-coverage [email or web address] Password [facility] 12/18/2015, 6:00 PM

## 2015-12-18 NOTE — ED Notes (Signed)
MD at bedside. 

## 2015-12-18 NOTE — ED Provider Notes (Signed)
[facility]-EMERGENCY DEPT Provider Note   CSN: [id number] Arrival date & time: 12/17/15  2124  By signing my name below, I, Sandrea Hammond, attest that this documentation has been prepared under the direction and in the presence of Gilda Crease, MD. Electronically Signed: Sandrea Hammond, ED Scribe. 12/18/15. 12:32 AM.   History   Chief Complaint Chief Complaint  Patient presents with  . Weakness    HPI Comments: George Cruz is a 60 y.o. male who presents to the Emergency Department complaining of diarrhea for 2 weeks. Pt says that any time he eats he experiences diarrhea. He also c/o vomiting for the past 6 days and says he cannot keep any food down. Pt also notes SOB exacerbated by walking as well as leg pain. Pt denies abdominal pain. He says he is not taking antibiotics.  The history is provided by the patient. No language interpreter was used.    Past Medical History:  Diagnosis Date  . Anemia   . Cancer (HCC)    colon  . COPD (chronic obstructive pulmonary disease) (HCC)   . Diabetes mellitus without complication (HCC)   . Hypertension   . Seizures [facility])     Patient Active Problem List   Diagnosis Date Noted  . AKI (acute kidney injury) (HCC) 12/18/2015  . Hyponatremia 12/18/2015  . Metabolic acidosis 12/18/2015  . Elevated troponin 12/18/2015  . Diarrhea 12/18/2015  . CAP (community acquired pneumonia) 12/18/2015  . Essential hypertension 12/18/2015  . COPD (chronic obstructive pulmonary disease) (HCC) 12/18/2015    Past Surgical History:  Procedure Laterality Date  . EXCISIONAL HEMORRHOIDECTOMY    . head surgery         Home Medications    Prior to Admission medications   Medication Sig Start Date End Date Taking? Authorizing Provider  albuterol (PROVENTIL HFA;VENTOLIN HFA) 108 (90 BASE) MCG/ACT inhaler Inhale 2 puffs into the lungs every 6 (six) hours as needed for wheezing or shortness of breath (wheezing).     Historical Provider, MD  amLODipine  (NORVASC) 10 MG tablet Take 1 tablet (10 mg total) by mouth daily. 07/03/15   Tomasita Crumble, MD  levETIRAcetam (KEPPRA) 500 MG tablet Take 1 tablet (500 mg total) by mouth daily. 07/03/15   Tomasita Crumble, MD  traMADol (ULTRAM) 50 MG tablet Take 1 tablet (50 mg total) by mouth every 12 (twelve) hours as needed. 07/03/15   Tomasita Crumble, MD    Family History Family History  Problem Relation Age of Onset  . Stroke Mother   . Cancer Father     Social History Social History  Substance Use Topics  . Smoking status: Current Some Day Smoker    Packs/day: 1.00    Types: Cigarettes  . Smokeless tobacco: Never Used  . Alcohol use Yes     Comment: 5 days/week     Allergies   Review of patient's allergies indicates no known allergies.   Review of Systems Review of Systems  Respiratory: Positive for shortness of breath.   Gastrointestinal: Positive for diarrhea. Negative for abdominal pain.  Musculoskeletal: Positive for arthralgias.  All other systems reviewed and are negative.    Physical Exam Updated Vital Signs BP 122/70   Pulse 69   Temp 98.7 F (37.1 C)   Resp 23   SpO2 91%   Physical Exam  Constitutional: He is oriented to person, place, and time. He appears well-developed and well-nourished. No distress.  HENT:  Head: Normocephalic and atraumatic.  Right Ear: Hearing normal.  Left Ear: Hearing normal.  Nose: Nose normal.  Mouth/Throat: Oropharynx is clear and moist and mucous membranes are normal.  Eyes: Conjunctivae and EOM are normal. Pupils are equal, round, and reactive to light.  Neck: Normal range of motion. Neck supple.  Cardiovascular: Regular rhythm, S1 normal and S2 normal.  Exam reveals no gallop and no friction rub.   No murmur heard. Pulmonary/Chest: Effort normal and breath sounds normal. No respiratory distress. He exhibits no tenderness.  Abdominal: Soft. Normal appearance and bowel sounds are normal. There is no hepatosplenomegaly. There is tenderness.  There is no rebound, no guarding, no tenderness at McBurney's point and negative Murphy's sign. No hernia.  Mild lower abdominal tenderness  Musculoskeletal: Normal range of motion.  Neurological: He is alert and oriented to person, place, and time. He has normal strength. No cranial nerve deficit or sensory deficit. Coordination normal. GCS eye subscore is 4. GCS verbal subscore is 5. GCS motor subscore is 6.  Skin: Skin is warm, dry and intact. No rash noted. No cyanosis.  Psychiatric: He has a normal mood and affect. His speech is normal and behavior is normal. Thought content normal.  Nursing note and vitals reviewed.    ED Treatments / Results   DIAGNOSTIC STUDIES: Oxygen Saturation is 100% on RA, adequate by my interpretation.    COORDINATION OF CARE: 12:25 AM Discussed treatment plan with pt at bedside and pt agreed to plan.   Labs (all labs ordered are listed, but only abnormal results are displayed) Labs Reviewed  BASIC METABOLIC PANEL - Abnormal; Notable for the following:       Result Value   Sodium 124 (*)    Chloride 95 (*)    CO2 14 (*)    Glucose, Bld 134 (*)    BUN 61 (*)    Creatinine, Ser 3.43 (*)    GFR calc non Af Amer 18 (*)    GFR calc Af Amer 21 (*)    All other components within normal limits  CBC - Abnormal; Notable for the following:    WBC 12.4 (*)    MCH 34.7 (*)    All other components within normal limits  URINALYSIS, ROUTINE W REFLEX MICROSCOPIC (NOT AT [facility]) - Abnormal; Notable for the following:    APPearance CLOUDY (*)    Hgb urine dipstick LARGE (*)    Protein, ur 100 (*)    All other components within normal limits  TROPONIN I - Abnormal; Notable for the following:    Troponin I 0.08 (*)    All other components within normal limits  URINE MICROSCOPIC-ADD ON - Abnormal; Notable for the following:    Squamous Epithelial / LPF 0-5 (*)    Bacteria, UA RARE (*)    Casts GRANULAR CAST (*)    All other components within normal limits  CBG  MONITORING, ED - Abnormal; Notable for the following:    Glucose-Capillary 128 (*)    All other components within normal limits  POC OCCULT BLOOD, ED - Abnormal; Notable for the following:    Fecal Occult Bld POSITIVE (*)    All other components within normal limits  C DIFFICILE QUICK SCREEN W PCR REFLEX  GASTROINTESTINAL PANEL BY PCR, STOOL (REPLACES STOOL CULTURE)  BRAIN NATRIURETIC PEPTIDE  I-STAT CG4 LACTIC ACID, ED    EKG  EKG Interpretation None       Radiology Ct Abdomen Pelvis Wo Contrast  Result Date: 12/18/2015 CLINICAL DATA:  Acute onset of nausea, vomiting and  diarrhea. Generalized abdominal pain and gross hematuria. Initial encounter. EXAM: CT ABDOMEN AND PELVIS WITHOUT CONTRAST TECHNIQUE: Multidetector CT imaging of the abdomen and pelvis was performed following the standard protocol without IV contrast. COMPARISON:  None. FINDINGS: Lower chest: Right basilar airspace opacity raises concern for pneumonia. The visualized portions of the mediastinum are unremarkable. Hepatobiliary: The liver is unremarkable in appearance. The gallbladder is unremarkable in appearance. The common bile duct remains normal in caliber. Pancreas: The pancreas is within normal limits. Spleen: The spleen is unremarkable in appearance. Adrenals/Urinary Tract: The adrenal glands are unremarkable in appearance. A tiny nonobstructing 3 mm stone is noted near the upper pole of the left kidney, and a 3 mm stone is noted at the lower pole of the right kidney. The kidneys are otherwise unremarkable. There is no evidence of hydronephrosis. No obstructing ureteral stones are identified. Mild nonspecific perinephric stranding is noted bilaterally. Stomach/Bowel: The stomach is unremarkable in appearance. The small bowel is within normal limits. The appendix is normal in caliber, without evidence of appendicitis. Mild diverticulosis is noted at the proximal sigmoid colon, without evidence of diverticulitis.  Vascular/Lymphatic: Minimal calcification is noted along the abdominal aorta. Reproductive: Anterior wall thickening along the bladder is nonspecific. Would correlate for any evidence of cystitis, and consider cystoscopy to exclude underlying mass. The prostate remains normal in size, with minimal calcification. Other: No additional soft tissue abnormalities are seen. Musculoskeletal: No acute osseous abnormalities are identified. There is minimal grade 1 retrolisthesis of L1 on L2, of L2 on L3 and of L3 on L4, and grade 2 anterolisthesis of L5 on S1. Chronic bilateral pars defects are noted at L5. The visualized musculature is unremarkable in appearance. IMPRESSION: 1. Right basilar airspace opacity is concerning for pneumonia. 2. Anterior bladder wall thickening is nonspecific. Would correlate for any evidence of cystitis, and consider cystoscopy to exclude underlying mass. 3. Nonobstructing small bilateral renal stones seen, measuring up to 3 mm in size. No evidence of hydronephrosis. 4. Mild diverticulosis at the proximal sigmoid colon, without evidence of diverticulitis. 5. Chronic bilateral pars defects at L5, with grade 2 anterolisthesis of L5 on S1. Electronically Signed   By: Roanna RaiderJeffery  Chang M.D.   On: 12/18/2015 03:25   Dg Chest 2 View  Result Date: 12/18/2015 CLINICAL DATA:  60 year old male with shortness of breath. EXAM: CHEST  2 VIEW COMPARISON:  Chest radiograph dated 07/03/2015 FINDINGS: The heart size and mediastinal contours are within normal limits. Both lungs are clear. The visualized skeletal structures are unremarkable. IMPRESSION: No active cardiopulmonary disease. Electronically Signed   By: Elgie CollardArash  Radparvar M.D.   On: 12/18/2015 02:19    Procedures Procedures (including critical care time)  Medications Ordered in ED Medications  iopamidol (ISOVUE-300) 61 % injection (not administered)  azithromycin (ZITHROMAX) 500 mg in dextrose 5 % 250 mL IVPB (not administered)  0.9 %  sodium  chloride infusion (not administered)  sodium chloride 0.9 % bolus 1,000 mL (0 mLs Intravenous Stopped 12/18/15 0340)  ondansetron (ZOFRAN) injection 4 mg (4 mg Intravenous Given 12/18/15 0130)  0.9 %  sodium chloride infusion (1,000 mLs Intravenous New Bag/Given 12/18/15 0548)  cefTRIAXone (ROCEPHIN) 1 g in dextrose 5 % 50 mL IVPB (1 g Intravenous New Bag/Given 12/18/15 0549)     Initial Impression / Assessment and Plan / ED Course  I have reviewed the triage vital signs and the nursing notes.  Pertinent labs & imaging results that were available during my care of the patient were reviewed by  me and considered in my medical decision making (see chart for details).  Clinical Course    Patient presented to the ER for evaluation of generalized weakness. Patient reports that he has been experiencing diarrhea for 2 weeks straight. He reports the diarrhea is dark and watery, like [employer]. He has vomited twice, but hasn't eaten anything in 6 days. He has taken very little fluids as well. Patient continues to work outside in the heat.  Patient does have bilateral lower abdominal tenderness to palpation, no guarding or rebound. Lab work reveals acute kidney injury, likely secondary to profound dehydration. He has undergone anion gap acidosis. This is likely secondary to bicarbonate loss from diarrhea. Lactic acid is normal. CT abdomen and pelvis revealed thickening of the bladder, but urinalysis does not suggest infection. CT scan also did show evidence of pneumonia in the right lung base. As patient has been experiencing shortness of breath, will initiate empiric treatment with Rocephin and Zithromax.  The planning of progressively worsening shortness of breath over a period of a month or more. EKG did not show ischemic changes but his troponin was slightly elevated. Will require cardiac rule out. Discussed with Dr. Maryfrances Bunnell, patient to be admitted for further treatment.  Final Clinical Impressions(s) /  ED Diagnoses   Final diagnoses:  Dehydration  AKI (acute kidney injury) (HCC)  Diarrhea, unspecified type    New Prescriptions New Prescriptions   No medications on file   I personally performed the services described in this documentation, which was scribed in my presence. The recorded information has been reviewed and is accurate.      Gilda Crease, MD 12/18/15 [phone]

## 2015-12-18 NOTE — ED Notes (Signed)
Gave pt a sprite per RN.

## 2015-12-18 NOTE — ED Notes (Signed)
Dr,. Kieth BrightlyAdrien MD at bedside.

## 2015-12-18 NOTE — ED Notes (Signed)
CBG 128 

## 2015-12-18 NOTE — H&P (Signed)
History and Physical  Patient Name: George Cruz     ZOX:[id number]    DOB: 03/07/1956    DOA: 12/17/2015 PCP: No PCP Per Patient   Patient coming from: Home  Chief Complaint: Malaise  HPI: George Cruz is a 60 y.o. male with a past medical history significant for HTN and seizures who presents with diarrhea for 2 weeks and now malaise and weakness/shortness of breath.  The patient was in his usual state of health until about two weeks ago when he had onset of loose watery stools, several times per day.  He was still able to function until about the last three days when he has had diarrhea with anything he takes by mouth, such that he hasn't eaten for several days, and has begun to feel weak and listless. Today he felt short of breath with walking across the room, so he came to the ER.  The diarrhea is watery and cold colored. He has had no abdominal pain, vomiting, hematochezia.  ED course: -Afebrile, heart rate 70s, respirations and pulse oximetry normal, blood pressure 127/84 -Na 124, K 3.6, Cr 3.43 (baseline 1.4 in April, 0.9 in 2016), WBC 12.4K, Hgb 15.6 -Lactic acid 0.8 -Troponin 0.08 (without chest pain), BNP normal -C diff negative -Fecal occult blood testing was positive but the blood was not grossly melanotic -Chest x-ray was clear -CT of the abdomen and pelvis without contrast showed infiltrate in the right base, nonspecific anterior bladder wall thickening and no other findings  He has had no exposure to untreated water, no travel outside [place]. No sick contacts or contaminated food exposures (eats fast food).  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  No IVDU.  No recent antibiotics.  He does not use NSAIDs.  His only medicine that he takes regularly is amlodipine.         ROS: Review of Systems  Constitutional: Positive for malaise/fatigue and weight loss (over last few weeks). Negative for chills and fever.  Respiratory:  Positive for cough and shortness of breath (with exertion). Negative for hemoptysis, sputum production and wheezing.   Cardiovascular: Negative for chest pain, orthopnea, leg swelling and PND.  Gastrointestinal: Positive for diarrhea (cola colored, >5 times per day, watery) and nausea (dereased appetite). Negative for abdominal pain, blood in stool, melena and vomiting.  Genitourinary: Negative for dysuria, flank pain, frequency, hematuria and urgency.  Neurological: Positive for weakness.  All other systems reviewed and are negative.         Past Medical History:  Diagnosis Date  . Anemia   . Cancer (HCC)    colon  . COPD (chronic obstructive pulmonary disease) (HCC)   . Diabetes mellitus without complication (HCC)   . Hypertension   . Seizures (HCC)     Past Surgical History:  Procedure Laterality Date  . EXCISIONAL HEMORRHOIDECTOMY    . head surgery      Social History: Patient lives with a friend.  The patient walks unassisted. He works in Aeronautical engineer.  He smokes, states he quit two weeks ago.    No Known Allergies  Family history: family history includes Cancer in his father; Stroke in his mother.  Prior to Admission medications   Medication Sig Start Date End Date Taking? Authorizing Provider  albuterol (PROVENTIL HFA;VENTOLIN HFA) 108 (90 BASE) MCG/ACT inhaler Inhale 2 puffs into the lungs every 6 (six) hours as needed for wheezing or shortness of breath (wheezing).  Historical Provider, MD  amLODipine (NORVASC) 10 MG tablet Take 1 tablet (10 mg total) by mouth daily. 07/03/15   Tomasita CrumbleAdeleke Oni, MD  levETIRAcetam (KEPPRA) 500 MG tablet Take 1 tablet (500 mg total) by mouth daily. 07/03/15   Tomasita CrumbleAdeleke Oni, MD  traMADol (ULTRAM) 50 MG tablet Take 1 tablet (50 mg total) by mouth every 12 (twelve) hours as needed. 07/03/15   Tomasita CrumbleAdeleke Oni, MD       Physical Exam: BP 122/70   Pulse 69   Temp 98.7 F (37.1 C)   Resp 23   SpO2 91%  General appearance: Well-developed, thin  adult male, alert and in no acute distress, anxious.   Eyes: Anicteric, conjunctiva pink, lids and lashes normal. PERRL.    ENT: No nasal deformity, discharge, epistaxis.  Hearing normal. OP moist without lesions.   Neck: No neck masses.  Trachea midline.  No thyromegaly/tenderness. Lymph: No cervical or supraclavicular lymphadenopathy. Skin: Warm and dry.  No jaundice.  No suspicious rashes or lesions. Cardiac: RRR, nl S1-S2, no murmurs appreciated.  Capillary refill is brisk.  JVP normal.  No LE edema.  Radial and DP pulses 2+ and symmetric. Respiratory: Normal respiratory rate and rhythm.  Diminished at right base, no rales. Abdomen: Abdomen soft.  Mild diffuse TTP, without guarding. No ascites, distension, hepatosplenomegaly.   MSK: No deformities or effusions.  No cyanosis or clubbing. Neuro: Cranial nerves normal.  Sensation intact to light touch. Speech is fluent.  Muscle strength normal.    Psych: Sensorium intact and responding to questions, attention normal.  Behavior appropriate.  Affect normal.  Judgment and insight appear normal.     Labs on Admission:  I have personally reviewed following labs and imaging studies: CBC:  Recent Labs Lab 12/17/15 2141  WBC 12.4*  HGB 15.6  HCT 43.9  MCV 97.8  PLT 192   Basic Metabolic Panel:  Recent Labs Lab 12/17/15 2141  NA 124*  K 3.6  CL 95*  CO2 14*  GLUCOSE 134*  BUN 61*  CREATININE 3.43*  CALCIUM 9.3   GFR: CrCl cannot be calculated (Unknown ideal weight.).  Liver Function Tests: No results for input(s): AST, ALT, ALKPHOS, BILITOT, PROT, ALBUMIN in the last 168 hours. No results for input(s): LIPASE, AMYLASE in the last 168 hours. No results for input(s): AMMONIA in the last 168 hours. Coagulation Profile: No results for input(s): INR, PROTIME in the last 168 hours. Cardiac Enzymes:  Recent Labs Lab 12/18/15 0026  TROPONINI 0.08*   BNP (last 3 results) No results for input(s): PROBNP in the last 8760  hours. HbA1C: No results for input(s): HGBA1C in the last 72 hours. CBG:  Recent Labs Lab 12/18/15 0047  GLUCAP 128*   Lipid Profile: No results for input(s): CHOL, HDL, LDLCALC, TRIG, CHOLHDL, LDLDIRECT in the last 72 hours. Thyroid Function Tests: No results for input(s): TSH, T4TOTAL, FREET4, T3FREE, THYROIDAB in the last 72 hours. Anemia Panel: No results for input(s): VITAMINB12, FOLATE, FERRITIN, TIBC, IRON, RETICCTPCT in the last 72 hours. Sepsis Labs: Lactic acid 0.8 Invalid input(s): PROCALCITONIN, LACTICIDVEN Recent Results (from the past 240 hour(s))  C difficile quick scan w PCR reflex     Status: None   Collection Time: 12/18/15  2:05 AM  Result Value Ref Range Status   C Diff antigen NEGATIVE NEGATIVE Final   C Diff toxin NEGATIVE NEGATIVE Final   C Diff interpretation No C. difficile detected.  Final         Radiological Exams on  Admission: Personally reviewed and CXR is clear but CT abdomen shows opacification of the right lung base: Ct Abdomen Pelvis Wo Contrast  Result Date: 12/18/2015 CLINICAL DATA:  Acute onset of nausea, vomiting and diarrhea. Generalized abdominal pain and gross hematuria. Initial encounter. EXAM: CT ABDOMEN AND PELVIS WITHOUT CONTRAST TECHNIQUE: Multidetector CT imaging of the abdomen and pelvis was performed following the standard protocol without IV contrast. COMPARISON:  None. FINDINGS: Lower chest: Right basilar airspace opacity raises concern for pneumonia. The visualized portions of the mediastinum are unremarkable. Hepatobiliary: The liver is unremarkable in appearance. The gallbladder is unremarkable in appearance. The common bile duct remains normal in caliber. Pancreas: The pancreas is within normal limits. Spleen: The spleen is unremarkable in appearance. Adrenals/Urinary Tract: The adrenal glands are unremarkable in appearance. A tiny nonobstructing 3 mm stone is noted near the upper pole of the left kidney, and a 3 mm stone is  noted at the lower pole of the right kidney. The kidneys are otherwise unremarkable. There is no evidence of hydronephrosis. No obstructing ureteral stones are identified. Mild nonspecific perinephric stranding is noted bilaterally. Stomach/Bowel: The stomach is unremarkable in appearance. The small bowel is within normal limits. The appendix is normal in caliber, without evidence of appendicitis. Mild diverticulosis is noted at the proximal sigmoid colon, without evidence of diverticulitis. Vascular/Lymphatic: Minimal calcification is noted along the abdominal aorta. Reproductive: Anterior wall thickening along the bladder is nonspecific. Would correlate for any evidence of cystitis, and consider cystoscopy to exclude underlying mass. The prostate remains normal in size, with minimal calcification. Other: No additional soft tissue abnormalities are seen. Musculoskeletal: No acute osseous abnormalities are identified. There is minimal grade 1 retrolisthesis of L1 on L2, of L2 on L3 and of L3 on L4, and grade 2 anterolisthesis of L5 on S1. Chronic bilateral pars defects are noted at L5. The visualized musculature is unremarkable in appearance. IMPRESSION: 1. Right basilar airspace opacity is concerning for pneumonia. 2. Anterior bladder wall thickening is nonspecific. Would correlate for any evidence of cystitis, and consider cystoscopy to exclude underlying mass. 3. Nonobstructing small bilateral renal stones seen, measuring up to 3 mm in size. No evidence of hydronephrosis. 4. Mild diverticulosis at the proximal sigmoid colon, without evidence of diverticulitis. 5. Chronic bilateral pars defects at L5, with grade 2 anterolisthesis of L5 on S1. Electronically Signed   By: Roanna Raider M.D.   On: 12/18/2015 03:25   Dg Chest 2 View  Result Date: 12/18/2015 CLINICAL DATA:  60 year old male with shortness of breath. EXAM: CHEST  2 VIEW COMPARISON:  Chest radiograph dated 07/03/2015 FINDINGS: The heart size and  mediastinal contours are within normal limits. Both lungs are clear. The visualized skeletal structures are unremarkable. IMPRESSION: No active cardiopulmonary disease. Electronically Signed   By: Elgie Collard M.D.   On: 12/18/2015 02:19     ECG: Rate 76, QTc 439, QRS normal, a few PACs, early repolarization without any ischemic changes.      Assessment/Plan 1. AKI:  Urine sediment bland and history consistent with ischemic/low perfusion injury.  No NSAID use in history nor other nephrotoxins.  No hydronephrosis on CT. -Check urine electroltes -Check P/C ratio -Fluids and trend BMP   2. Hyponatremia:  Suspect this is from dehydration -Check urine electrolytes and osmolality -Fluids and trend BMP  3. Diarrhea:  C diff negative.   -IVF -Follow GI pathogen panel -If negative, will treat with loperamide  4. Community-acquired pneumonia:  Identified on CT.  Cough and  leukocytosis. -Ceftriaxone and azithromycin  -Sputum culture -Check HIV and urine strep antigen  5. Elevated troponin:  Suspect demand.  No chest pain reported.  ECG normal. -Trend troponin  6. Essential hypertension:  -Hold amlodipine until hemodynamics clearer  7. COPD:  No wheezing, clinically inactive. -Albuterol PRN  8. History of seizures: Patient states he no longer takes Keppra, no recent seizures.  9. Bladder wall thickening: -Non-emergent Urology follow up     DVT prophylaxis: Lovenox low dose Code Status: FULL  Family Communication: None present  Disposition Plan: Anticipate fluids and trend renal function.  Follow laboratory studies.   Consults called: None Admission status: INPATIENT, med surg       Medical decision making: Patient seen at 5:58 AM on 12/18/2015.  The patient was discussed with Dr. Blinda Leatherwood.  What exists of the patient's chart was reviewed in depth and summarized above.  Clinical condition: stable.        Alberteen Sam [facility] Pager  [phone]

## 2015-12-19 DIAGNOSIS — A419 Sepsis, unspecified organism: Principal | ICD-10-CM

## 2015-12-19 LAB — CBC WITH DIFFERENTIAL/PLATELET
BASOS PCT: 0 %
Basophils Absolute: 0 10*3/uL (ref 0.0–0.1)
EOS ABS: 0 10*3/uL (ref 0.0–0.7)
EOS PCT: 0 %
HCT: 39 % (ref 39.0–52.0)
Hemoglobin: 13.6 g/dL (ref 13.0–17.0)
LYMPHS ABS: 1.3 10*3/uL (ref 0.7–4.0)
Lymphocytes Relative: 16 %
MCH: 34.1 pg — AB (ref 26.0–34.0)
MCHC: 34.9 g/dL (ref 30.0–36.0)
MCV: 97.7 fL (ref 78.0–100.0)
Monocytes Absolute: 1.2 10*3/uL — ABNORMAL HIGH (ref 0.1–1.0)
Monocytes Relative: 14 %
Neutro Abs: 5.7 10*3/uL (ref 1.7–7.7)
Neutrophils Relative %: 69 %
PLATELETS: 170 10*3/uL (ref 150–400)
RBC: 3.99 MIL/uL — AB (ref 4.22–5.81)
RDW: 12.9 % (ref 11.5–15.5)
WBC: 8.3 10*3/uL (ref 4.0–10.5)

## 2015-12-19 LAB — BASIC METABOLIC PANEL
Anion gap: 10 (ref 5–15)
BUN: 32 mg/dL — AB (ref 6–20)
CO2: 20 mmol/L — ABNORMAL LOW (ref 22–32)
CREATININE: 1.34 mg/dL — AB (ref 0.61–1.24)
Calcium: 8.4 mg/dL — ABNORMAL LOW (ref 8.9–10.3)
Chloride: 97 mmol/L — ABNORMAL LOW (ref 101–111)
GFR calc Af Amer: >60 mL/min (ref >60)
GFR, EST NON AFRICAN AMERICAN: 56 mL/min — AB (ref >60)
Glucose, Bld: 99 mg/dL (ref 65–99)
POTASSIUM: 3.7 mmol/L (ref 3.5–5.1)
SODIUM: 127 mmol/L — AB (ref 135–145)

## 2015-12-19 LAB — STREP PNEUMONIAE URINARY ANTIGEN: Strep Pneumo Urinary Antigen: NEGATIVE

## 2015-12-19 LAB — HIV ANTIBODY (ROUTINE TESTING W REFLEX): HIV SCREEN 4TH GENERATION: NONREACTIVE

## 2015-12-19 MED ORDER — IPRATROPIUM-ALBUTEROL 0.5-2.5 (3) MG/3ML IN SOLN: 3.0000 mL | RESPIRATORY_TRACT | Status: DC | PRN

## 2015-12-19 MED ORDER — ENOXAPARIN SODIUM 40 MG/0.4ML ~~LOC~~ SOLN
40.0000 mg | SUBCUTANEOUS | Status: DC
  Administered 2015-12-19 – 2015-12-21 (×3): 40 mg via SUBCUTANEOUS
  Filled 2015-12-19 (×3): qty 0.4

## 2015-12-19 MED ORDER — IPRATROPIUM-ALBUTEROL 0.5-2.5 (3) MG/3ML IN SOLN
3.0000 mL | Freq: Four times a day (QID) | RESPIRATORY_TRACT | Status: DC
  Administered 2015-12-19: 3 mL via RESPIRATORY_TRACT
  Filled 2015-12-19 (×2): qty 3

## 2015-12-19 NOTE — Progress Notes (Signed)
Medium bright bloody stool reported by pt and seen by another nurse. MD aware and reported that the pt has had loose stools. MD reported to continue to monitor pts stools.

## 2015-12-19 NOTE — Progress Notes (Signed)
PROGRESS NOTE    George Cruz  ZOX:[id number] DOB: Jul 06, 1955 DOA: 12/17/2015 PCP: No PCP Per Patient    Brief Narrative: Sepsis due to community acquired pneumonia complicated with acute kidney injury, diarrhea, hyponatremia. 60 year old male who presents with a chief complaint of malaise, diarrhea for last 2 weeks, decrease exercise functional capacity due to worsening dyspnea. Chronic tobacco abuse.    Assessment & Plan:   Principal Problem:   AKI (acute kidney injury) (HCC) Active Problems:   Hyponatremia   Metabolic acidosis   Elevated troponin   Diarrhea   CAP (community acquired pneumonia)   Essential hypertension   COPD (chronic obstructive pulmonary disease) (HCC)   Chest pain   1. AKI. Improved renal function with cr down to 1,43, patient tolerating po well. Will continue to hold on IV fluids for now, and follow renal panel in am. K at 4,3 and serum bicarb at 20 with cl at 101.   2. Sepsis with community acquired pneumonia. Will continue antibiotic therapy with ceftriaxone and azithromycin, bronchodilator therapy with duonebs, will continue oxymetry monitoring and supplemental 02 per Guttenberg to target 02 sat above 92%.   3. Sepsis. Will continue restrictive IV fluids strategy to avoid volume overload, Wbc at 7,8. Check blood cultures , T max 102.6 over last 24 hours.   4, Diarrhea. Improved, patient tolerating po well.   4, Hyponatremia. Na improved from 124 to 127, will continue to encourage po intake, continue to hold on IV fluids for now.   5. Elevated troponin. Peal troponin up to 0.06 with no ekg changes, elevation of cardiac markers due to sepsis and renal failure. Will d.c telemetry for now and will transfer patient to the medical unit.   6. Seizures. Will resume keppra per home regimen.    DVT prophylaxis: lovenox Code Status: full  Family Communication: no family at bedside Disposition Plan: home    Consultants:    Procedures:     Antimicrobials:  Ceftriaxone  Azithromycin    Subjective: Patient with improved dyspnea, persistent cough, no chest pain, nausea or vomiting. No diarrhea. Positive pleuritic chest pain and productive cough.  Objective: Vitals:   12/18/15 2100 12/19/15 0000 12/19/15 0431 12/19/15 0817  BP: 108/71 112/73 135/86 111/74  Pulse: 65 77 69 65  Resp: 17 (!) 24 (!) 24 18  Temp: 98.8 F (37.1 C) 98.7 F (37.1 C) 99 F (37.2 C) 99.3 F (37.4 C)  TempSrc: Oral Oral Oral Oral  SpO2: 98% 93% 95% 96%  Weight:      Height:        Intake/Output Summary (Last 24 hours) at 12/19/15 0837 Last data filed at 12/19/15 0347  Gross per 24 hour  Intake                0 ml  Output             1005 ml  Net            -1005 ml   Filed Weights   12/18/15 1330  Weight: 85.1 kg (187 lb 9.8 oz)    Examination:  General exam: deconditioned  E ENT: mild conjunctival pallor, oral mucosa moist. Respiratory system: decreased breath sounds bilaterally, with rales at the right base, poor ventilation, no wheezing or rhonchi.  Cardiovascular system: S1 & S2 heard, RRR. No JVD, murmurs, rubs, gallops or clicks. No pedal edema. Gastrointestinal system: Abdomen is nondistended, soft and nontender. No organomegaly or masses felt. Normal bowel sounds heard. Central nervous system:  Alert and oriented. No focal neurological deficits. Extremities: Symmetric 5 x 5 power. Skin: No rashes, lesions or ulcers     Data Reviewed: I have personally reviewed following labs and imaging studies  CBC:  Recent Labs Lab 12/17/15 2141 12/18/15 1451 12/19/15 0256  WBC 12.4* 9.2 8.3  NEUTROABS  --  7.1 5.7  HGB 15.6 14.4 13.6  HCT 43.9 40.8 39.0  MCV 97.8 97.6 97.7  PLT 192 160 170   Basic Metabolic Panel:  Recent Labs Lab 12/17/15 2141 12/18/15 1451 12/19/15 0256  NA 124* 124* 127*  K 3.6 3.4* 3.7  CL 95* 98* 97*  CO2 14* 17* 20*  GLUCOSE 134* 124* 99  BUN 61* 45* 32*  CREATININE 3.43* 1.83*  1.34*  CALCIUM 9.3 8.4* 8.4*   GFR: Estimated Creatinine Clearance: 67.1 mL/min (by C-G formula based on SCr of 1.34 mg/dL (H)). Liver Function Tests:  Recent Labs Lab 12/18/15 1451  AST 142*  ALT 91*  ALKPHOS 62  BILITOT 0.9  PROT 7.1  ALBUMIN 3.1*   No results for input(s): LIPASE, AMYLASE in the last 168 hours. No results for input(s): AMMONIA in the last 168 hours. Coagulation Profile: No results for input(s): INR, PROTIME in the last 168 hours. Cardiac Enzymes:  Recent Labs Lab 12/18/15 0026 12/18/15 0928 12/18/15 1451 12/18/15 2020  CKTOTAL  --  1,256* 942* 708*  CKMB  --  31.8* 18.7* 11.4*  TROPONINI 0.08* <0.03 0.04*  0.06* <0.03   BNP (last 3 results) No results for input(s): PROBNP in the last 8760 hours. HbA1C: No results for input(s): HGBA1C in the last 72 hours. CBG:  Recent Labs Lab 12/18/15 0047  GLUCAP 128*   Lipid Profile: No results for input(s): CHOL, HDL, LDLCALC, TRIG, CHOLHDL, LDLDIRECT in the last 72 hours. Thyroid Function Tests: No results for input(s): TSH, T4TOTAL, FREET4, T3FREE, THYROIDAB in the last 72 hours. Anemia Panel: No results for input(s): VITAMINB12, FOLATE, FERRITIN, TIBC, IRON, RETICCTPCT in the last 72 hours. Sepsis Labs:  Recent Labs Lab 12/18/15 0056  LATICACIDVEN 0.86    Recent Results (from the past 240 hour(s))  C difficile quick scan w PCR reflex     Status: None   Collection Time: 12/18/15  2:05 AM  Result Value Ref Range Status   C Diff antigen NEGATIVE NEGATIVE Final   C Diff toxin NEGATIVE NEGATIVE Final   C Diff interpretation No C. difficile detected.  Final  Gastrointestinal Panel by PCR , Stool     Status: None   Collection Time: 12/18/15  2:05 AM  Result Value Ref Range Status   Campylobacter species NOT DETECTED NOT DETECTED Final   Plesimonas shigelloides NOT DETECTED NOT DETECTED Final   Salmonella species NOT DETECTED NOT DETECTED Final   Yersinia enterocolitica NOT DETECTED NOT  DETECTED Final   Vibrio species NOT DETECTED NOT DETECTED Final   Vibrio cholerae NOT DETECTED NOT DETECTED Final   Enteroaggregative E coli (EAEC) NOT DETECTED NOT DETECTED Final   Enteropathogenic E coli (EPEC) NOT DETECTED NOT DETECTED Final   Enterotoxigenic E coli (ETEC) NOT DETECTED NOT DETECTED Final   Shiga like toxin producing E coli (STEC) NOT DETECTED NOT DETECTED Final   E. coli O157 NOT DETECTED NOT DETECTED Final   Shigella/Enteroinvasive E coli (EIEC) NOT DETECTED NOT DETECTED Final   Cryptosporidium NOT DETECTED NOT DETECTED Final   Cyclospora cayetanensis NOT DETECTED NOT DETECTED Final   Entamoeba histolytica NOT DETECTED NOT DETECTED Final   Giardia lamblia NOT  DETECTED NOT DETECTED Final   Adenovirus F40/41 NOT DETECTED NOT DETECTED Final   Astrovirus NOT DETECTED NOT DETECTED Final   Norovirus GI/GII NOT DETECTED NOT DETECTED Final   Rotavirus A NOT DETECTED NOT DETECTED Final   Sapovirus (I, II, IV, and V) NOT DETECTED NOT DETECTED Final  MRSA PCR Screening     Status: None   Collection Time: 12/18/15  1:25 PM  Result Value Ref Range Status   MRSA by PCR NEGATIVE NEGATIVE Final    Comment:        The GeneXpert MRSA Assay (FDA approved for NASAL specimens only), is one component of a comprehensive MRSA colonization surveillance program. It is not intended to diagnose MRSA infection nor to guide or monitor treatment for MRSA infections.          Radiology Studies: Ct Abdomen Pelvis Wo Contrast  Result Date: 12/18/2015 CLINICAL DATA:  Acute onset of nausea, vomiting and diarrhea. Generalized abdominal pain and gross hematuria. Initial encounter. EXAM: CT ABDOMEN AND PELVIS WITHOUT CONTRAST TECHNIQUE: Multidetector CT imaging of the abdomen and pelvis was performed following the standard protocol without IV contrast. COMPARISON:  None. FINDINGS: Lower chest: Right basilar airspace opacity raises concern for pneumonia. The visualized portions of the  mediastinum are unremarkable. Hepatobiliary: The liver is unremarkable in appearance. The gallbladder is unremarkable in appearance. The common bile duct remains normal in caliber. Pancreas: The pancreas is within normal limits. Spleen: The spleen is unremarkable in appearance. Adrenals/Urinary Tract: The adrenal glands are unremarkable in appearance. A tiny nonobstructing 3 mm stone is noted near the upper pole of the left kidney, and a 3 mm stone is noted at the lower pole of the right kidney. The kidneys are otherwise unremarkable. There is no evidence of hydronephrosis. No obstructing ureteral stones are identified. Mild nonspecific perinephric stranding is noted bilaterally. Stomach/Bowel: The stomach is unremarkable in appearance. The small bowel is within normal limits. The appendix is normal in caliber, without evidence of appendicitis. Mild diverticulosis is noted at the proximal sigmoid colon, without evidence of diverticulitis. Vascular/Lymphatic: Minimal calcification is noted along the abdominal aorta. Reproductive: Anterior wall thickening along the bladder is nonspecific. Would correlate for any evidence of cystitis, and consider cystoscopy to exclude underlying mass. The prostate remains normal in size, with minimal calcification. Other: No additional soft tissue abnormalities are seen. Musculoskeletal: No acute osseous abnormalities are identified. There is minimal grade 1 retrolisthesis of L1 on L2, of L2 on L3 and of L3 on L4, and grade 2 anterolisthesis of L5 on S1. Chronic bilateral pars defects are noted at L5. The visualized musculature is unremarkable in appearance. IMPRESSION: 1. Right basilar airspace opacity is concerning for pneumonia. 2. Anterior bladder wall thickening is nonspecific. Would correlate for any evidence of cystitis, and consider cystoscopy to exclude underlying mass. 3. Nonobstructing small bilateral renal stones seen, measuring up to 3 mm in size. No evidence of  hydronephrosis. 4. Mild diverticulosis at the proximal sigmoid colon, without evidence of diverticulitis. 5. Chronic bilateral pars defects at L5, with grade 2 anterolisthesis of L5 on S1. Electronically Signed   By: Roanna RaiderJeffery  Chang M.D.   On: 12/18/2015 03:25   Dg Chest 1 View  Result Date: 12/18/2015 CLINICAL DATA:  Cough, dyspnea EXAM: CHEST 1 VIEW COMPARISON:  12/18/2015 FINDINGS: Cardiomediastinal silhouette is stable. No acute infiltrate or pleural effusion. No pulmonary edema. Mild elevation of the left hemidiaphragm again noted. IMPRESSION: No active disease. Electronically Signed   By: Lanette HampshireLiviu  Pop M.D.  On: 12/18/2015 13:42   Dg Chest 2 View  Result Date: 12/18/2015 CLINICAL DATA:  60 year old male with shortness of breath. EXAM: CHEST  2 VIEW COMPARISON:  Chest radiograph dated 07/03/2015 FINDINGS: The heart size and mediastinal contours are within normal limits. Both lungs are clear. The visualized skeletal structures are unremarkable. IMPRESSION: No active cardiopulmonary disease. Electronically Signed   By: Elgie Collard M.D.   On: 12/18/2015 02:19        Scheduled Meds: . azithromycin  500 mg Oral Q24H  . cefTRIAXone (ROCEPHIN)  IV  1 g Intravenous Q24H  . enoxaparin (LOVENOX) injection  30 mg Subcutaneous Q24H  . levETIRAcetam  500 mg Oral Daily   Continuous Infusions:    LOS: 1 day        Abeeha Twist Annett Gula, MD [facility] Pager [phone]  If 7PM-7AM, please contact night-coverage [email or web address] Password [facility] 12/19/2015, 8:37 AM

## 2015-12-19 NOTE — Progress Notes (Signed)
Pt has had several frank red blood from rectum.Ilean SkillVeronica Jalayna Josten LPN

## 2015-12-19 NOTE — Progress Notes (Signed)
Patient transferring to 5W. Report called to receiving nurse. All questions answered. Patient's belongings (clothes, cell phone) transferred with patient.

## 2015-12-19 NOTE — Progress Notes (Signed)
Verbal order from Arrien, MD to discontinue cardiac monitoring.

## 2015-12-20 LAB — CBC WITH DIFFERENTIAL/PLATELET
BASOS ABS: 0 10*3/uL (ref 0.0–0.1)
Basophils Relative: 0 %
Eosinophils Absolute: 0.1 10*3/uL (ref 0.0–0.7)
Eosinophils Relative: 2 %
HEMATOCRIT: 38.1 % — AB (ref 39.0–52.0)
Hemoglobin: 13.1 g/dL (ref 13.0–17.0)
LYMPHS ABS: 1.6 10*3/uL (ref 0.7–4.0)
LYMPHS PCT: 19 %
MCH: 33.9 pg (ref 26.0–34.0)
MCHC: 34.4 g/dL (ref 30.0–36.0)
MCV: 98.7 fL (ref 78.0–100.0)
MONO ABS: 1.2 10*3/uL — AB (ref 0.1–1.0)
MONOS PCT: 14 %
NEUTROS ABS: 5.4 10*3/uL (ref 1.7–7.7)
Neutrophils Relative %: 65 %
Platelets: 215 10*3/uL (ref 150–400)
RBC: 3.86 MIL/uL — ABNORMAL LOW (ref 4.22–5.81)
RDW: 13.2 % (ref 11.5–15.5)
WBC: 8.3 10*3/uL (ref 4.0–10.5)

## 2015-12-20 LAB — BASIC METABOLIC PANEL
ANION GAP: 8 (ref 5–15)
BUN: 21 mg/dL — AB (ref 6–20)
CALCIUM: 8.7 mg/dL — AB (ref 8.9–10.3)
CO2: 21 mmol/L — AB (ref 22–32)
Chloride: 101 mmol/L (ref 101–111)
Creatinine, Ser: 0.91 mg/dL (ref 0.61–1.24)
GFR calc Af Amer: >60 mL/min (ref >60)
GFR calc non Af Amer: >60 mL/min (ref >60)
GLUCOSE: 112 mg/dL — AB (ref 65–99)
Potassium: 3.3 mmol/L — ABNORMAL LOW (ref 3.5–5.1)
Sodium: 130 mmol/L — ABNORMAL LOW (ref 135–145)

## 2015-12-20 MED ORDER — POTASSIUM CHLORIDE CRYS ER 20 MEQ PO TBCR
40.0000 meq | EXTENDED_RELEASE_TABLET | Freq: Once | ORAL | Status: AC
Start: 2015-12-20 — End: 2015-12-20
  Administered 2015-12-20: 40 meq via ORAL
  Filled 2015-12-20: qty 2

## 2015-12-20 MED ORDER — HYDROCORTISONE ACETATE 25 MG RE SUPP
25.0000 mg | Freq: Two times a day (BID) | RECTAL | Status: DC
  Administered 2015-12-20 – 2015-12-24 (×6): 25 mg via RECTAL
  Filled 2015-12-20 (×7): qty 1

## 2015-12-20 MED ORDER — PANTOPRAZOLE SODIUM 40 MG PO TBEC
40.0000 mg | DELAYED_RELEASE_TABLET | Freq: Every day | ORAL | Status: DC
  Administered 2015-12-20 – 2015-12-24 (×5): 40 mg via ORAL
  Filled 2015-12-20 (×5): qty 1

## 2015-12-20 NOTE — Progress Notes (Signed)
PROGRESS NOTE    George Cruz  ZOX:[id number] DOB: 04/07/1955 DOA: 12/17/2015 PCP: No PCP Per Patient   Brief Narrative:  Sepsis due to community acquired pneumonia complicated with acute kidney injury, diarrhea, hyponatremia. 60 year old male who presents with a chief complaint of malaise, diarrhea for last 2 weeks, decrease exercise functional capacity due to worsening dyspnea. Chronic tobacco abuse  Assessment & Plan:   Principal Problem:   AKI (acute kidney injury) (HCC) Active Problems:   Hyponatremia   Metabolic acidosis   Elevated troponin   Diarrhea   CAP (community acquired pneumonia)   Essential hypertension   COPD (chronic obstructive pulmonary disease) (HCC)   Chest pain  1. AKI. Improved renal function with cr down to 0.91, patient tolerating po well. Will continue to hold on IV fluids and follow a restrictive IV fluids strategy. Renal panel in am. K at 3,3 and serum bicarb at 21 with cl at 101.   2. Sepsis with community acquired pneumonia. Continue antibiotic therapy with ceftriaxone and azithromycin, bronchodilator therapy with duonebs, will continue oxymetry monitoring and supplemental 02 per [place] to target 02 sat above 92%. Follow chest film in am.   3. Sepsis. Will continue restrictive IV fluids strategy to avoid volume overload, Wbc at 8,3. Will continue to follow on blood cultures, temperature curve and cell count.  4, Diarrhea/  Positive hematochezia. Patient with history of hemorrhoids, will start patient on anusol and continue to follow hb and hct, if not improving will consult GI. Will start patient on pantoprazole for now.    4, Hyponatremia/ Hypokalemia. Na improved up to 130, will continue to encourage po intake, continue to hold on IV fluids for now. Will replete K with 40 meq kcl, follow renal panel in am.   5. Elevated troponin.  Troponin up to 0.06 with no ekg changes, elevation of cardiac markers due to sepsis and renal failure. Resolved.   6.  Seizures. Will resume keppra per home regimen.   7.   DVT prophylaxis:lovenox Code Status:full  Family Communication:no family at bedside Disposition Plan:home     Consultants:     Procedures:    Antimicrobials:    Ceftriaxone  Azithromycin   Subjective: Patient with persistent dyspnea, worse with ambulation, associated with cough and pleuritic chest pain. Positive bright red blood per rectum with bowel movement, painful. Had hemorrhoids in the past. No abdominal pain, no nausea or vomiting, poor appetite.   Objective: Vitals:   12/19/15 1531 12/19/15 2028 12/19/15 2124 12/20/15 0500  BP: 129/88  130/78 (!) 139/98  Pulse: 61  76 62  Resp: 18  18 18   Temp: 98.1 F (36.7 C)  98 F (36.7 C) 98.3 F (36.8 C)  TempSrc: Oral  Oral Oral  SpO2: 100% 98% 94% 97%  Weight:      Height:        Intake/Output Summary (Last 24 hours) at 12/20/15 1028 Last data filed at 12/20/15 0439  Gross per 24 hour  Intake               50 ml  Output                0 ml  Net               50 ml   Filed Weights   12/18/15 1330  Weight: 85.1 kg (187 lb 9.8 oz)    Examination:  General exam: deconditioned not in pain or dyspnea E ENT: no conjunctival pallor, oral mucosa  moist.  Respiratory system: Decreased breath sounds bilaterally, with faint rales at base, no wheezing, rales or rhonchi. Cardiovascular system: S1 & S2 heard, RRR. No JVD, murmurs, rubs, gallops or clicks. No pedal edema. Gastrointestinal system: Abdomen is nondistended, soft and nontender. No organomegaly or masses felt. Normal bowel sounds heard. Central nervous system: Alert and oriented. No focal neurological deficits. Extremities: Symmetric 5 x 5 power. Skin: No rashes, lesions or ulcers     Data Reviewed: I have personally reviewed following labs and imaging studies  CBC:  Recent Labs Lab 12/17/15 2141 12/18/15 1451 12/19/15 0256 12/20/15 0621  WBC 12.4* 9.2 8.3 8.3  NEUTROABS  --  7.1 5.7  5.4  HGB 15.6 14.4 13.6 13.1  HCT 43.9 40.8 39.0 38.1*  MCV 97.8 97.6 97.7 98.7  PLT 192 160 170 215   Basic Metabolic Panel:  Recent Labs Lab 12/17/15 2141 12/18/15 1451 12/19/15 0256 12/20/15 0621  NA 124* 124* 127* 130*  K 3.6 3.4* 3.7 3.3*  CL 95* 98* 97* 101  CO2 14* 17* 20* 21*  GLUCOSE 134* 124* 99 112*  BUN 61* 45* 32* 21*  CREATININE 3.43* 1.83* 1.34* 0.91  CALCIUM 9.3 8.4* 8.4* 8.7*   GFR: Estimated Creatinine Clearance: 98.8 mL/min (by C-G formula based on SCr of 0.91 mg/dL). Liver Function Tests:  Recent Labs Lab 12/18/15 1451  AST 142*  ALT 91*  ALKPHOS 62  BILITOT 0.9  PROT 7.1  ALBUMIN 3.1*   No results for input(s): LIPASE, AMYLASE in the last 168 hours. No results for input(s): AMMONIA in the last 168 hours. Coagulation Profile: No results for input(s): INR, PROTIME in the last 168 hours. Cardiac Enzymes:  Recent Labs Lab 12/18/15 0026 12/18/15 0928 12/18/15 1451 12/18/15 2020  CKTOTAL  --  1,256* 942* 708*  CKMB  --  31.8* 18.7* 11.4*  TROPONINI 0.08* <0.03 0.04*  0.06* <0.03   BNP (last 3 results) No results for input(s): PROBNP in the last 8760 hours. HbA1C: No results for input(s): HGBA1C in the last 72 hours. CBG:  Recent Labs Lab 12/18/15 0047  GLUCAP 128*   Lipid Profile: No results for input(s): CHOL, HDL, LDLCALC, TRIG, CHOLHDL, LDLDIRECT in the last 72 hours. Thyroid Function Tests: No results for input(s): TSH, T4TOTAL, FREET4, T3FREE, THYROIDAB in the last 72 hours. Anemia Panel: No results for input(s): VITAMINB12, FOLATE, FERRITIN, TIBC, IRON, RETICCTPCT in the last 72 hours. Sepsis Labs:  Recent Labs Lab 12/18/15 0056  LATICACIDVEN 0.86    Recent Results (from the past 240 hour(s))  C difficile quick scan w PCR reflex     Status: None   Collection Time: 12/18/15  2:05 AM  Result Value Ref Range Status   C Diff antigen NEGATIVE NEGATIVE Final   C Diff toxin NEGATIVE NEGATIVE Final   C Diff  interpretation No C. difficile detected.  Final  Gastrointestinal Panel by PCR , Stool     Status: None   Collection Time: 12/18/15  2:05 AM  Result Value Ref Range Status   Campylobacter species NOT DETECTED NOT DETECTED Final   Plesimonas shigelloides NOT DETECTED NOT DETECTED Final   Salmonella species NOT DETECTED NOT DETECTED Final   Yersinia enterocolitica NOT DETECTED NOT DETECTED Final   Vibrio species NOT DETECTED NOT DETECTED Final   Vibrio cholerae NOT DETECTED NOT DETECTED Final   Enteroaggregative E coli (EAEC) NOT DETECTED NOT DETECTED Final   Enteropathogenic E coli (EPEC) NOT DETECTED NOT DETECTED Final   Enterotoxigenic E coli (  ETEC) NOT DETECTED NOT DETECTED Final   Shiga like toxin producing E coli (STEC) NOT DETECTED NOT DETECTED Final   E. coli O157 NOT DETECTED NOT DETECTED Final   Shigella/Enteroinvasive E coli (EIEC) NOT DETECTED NOT DETECTED Final   Cryptosporidium NOT DETECTED NOT DETECTED Final   Cyclospora cayetanensis NOT DETECTED NOT DETECTED Final   Entamoeba histolytica NOT DETECTED NOT DETECTED Final   Giardia lamblia NOT DETECTED NOT DETECTED Final   Adenovirus F40/41 NOT DETECTED NOT DETECTED Final   Astrovirus NOT DETECTED NOT DETECTED Final   Norovirus GI/GII NOT DETECTED NOT DETECTED Final   Rotavirus A NOT DETECTED NOT DETECTED Final   Sapovirus (I, II, IV, and V) NOT DETECTED NOT DETECTED Final  MRSA PCR Screening     Status: None   Collection Time: 12/18/15  1:25 PM  Result Value Ref Range Status   MRSA by PCR NEGATIVE NEGATIVE Final    Comment:        The GeneXpert MRSA Assay (FDA approved for NASAL specimens only), is one component of a comprehensive MRSA colonization surveillance program. It is not intended to diagnose MRSA infection nor to guide or monitor treatment for MRSA infections.          Radiology Studies: Dg Chest 1 View  Result Date: 12/18/2015 CLINICAL DATA:  Cough, dyspnea EXAM: CHEST 1 VIEW COMPARISON:   12/18/2015 FINDINGS: Cardiomediastinal silhouette is stable. No acute infiltrate or pleural effusion. No pulmonary edema. Mild elevation of the left hemidiaphragm again noted. IMPRESSION: No active disease. Electronically Signed   By: Natasha Mead M.D.   On: 12/18/2015 13:42        Scheduled Meds: . azithromycin  500 mg Oral Q24H  . cefTRIAXone (ROCEPHIN)  IV  1 g Intravenous Q24H  . enoxaparin (LOVENOX) injection  40 mg Subcutaneous Q24H  . hydrocortisone  25 mg Rectal BID  . levETIRAcetam  500 mg Oral Daily   Continuous Infusions:    LOS: 2 days      Kiki Bivens Annett Gula, MD [facility] Pager [phone]  If 7PM-7AM, please contact night-coverage [email or web address] Password [email or web address] 12/20/2015, 10:28 AM

## 2015-12-20 NOTE — Evaluation (Signed)
Physical Therapy Evaluation Patient Details Name: George Cruz MRN: [id number] DOB: [birth year] Today's Date: 12/20/2015   History of Present Illness  Pt is a 60 y/o male admitted secondary to AKI, hyponatremia and diarrhea. PMH including but not limited to DM, HTN, COPD, seizures and colon cancer.  Clinical Impression  Pt presented supine in bed with HOB elevated, awake and willing to participate in therapy session. Prior to admission, pt stated that he was independent with functional mobility and works as a Administrator. Pt stated that he has no family in [place] and recently moved from [place]. Pt stated that he currently lives with a coworker. Pt would continue to benefit from skilled physical therapy services at this time while admitted and after d/c to address his below listed limitations in order to improve his overall safety and independence with functional mobility.     Follow Up Recommendations Home health PT;Supervision for mobility/OOB    Equipment Recommendations  Cane    Recommendations for Other Services       Precautions / Restrictions Precautions Precautions: Fall Restrictions Weight Bearing Restrictions: No      Mobility  Bed Mobility Overal bed mobility: Needs Assistance Bed Mobility: Supine to Sit;Sit to Supine     Supine to sit: Supervision;HOB elevated Sit to supine: Supervision   General bed mobility comments: pt required increased time and HOB slightly elevated  Transfers Overall transfer level: Needs assistance Equipment used: None Transfers: Sit to/from Stand Sit to Stand: Min guard         General transfer comment: pt required increased time, min guard for safety  Ambulation/Gait Ambulation/Gait assistance: Min guard Ambulation Distance (Feet): 10 Feet (10' x2 with standing rest break) Assistive device: None Gait Pattern/deviations: Step-through pattern;Decreased step length - right;Decreased step length - left;Decreased stride length Gait  velocity: decreased Gait velocity interpretation: Below normal speed for age/gender General Gait Details: pt moved very slowly and reported tingling beginning in bilateral hands and then weakness in bilateral LEs  Stairs            Wheelchair Mobility    Modified Rankin (Stroke Patients Only)       Balance Overall balance assessment: Needs assistance Sitting-balance support: Feet supported;No upper extremity supported Sitting balance-Leahy Scale: Fair     Standing balance support: During functional activity;No upper extremity supported Standing balance-Leahy Scale: Fair                               Pertinent Vitals/Pain Pain Assessment: Faces Faces Pain Scale: Hurts little more Pain Location: unable to specify Pain Descriptors / Indicators: Grimacing;Moaning Pain Intervention(s): Monitored during session    Home Living Family/patient expects to be discharged to:: Private residence Living Arrangements: Non-relatives/Friends Available Help at Discharge: Friend(s);Available PRN/intermittently   Home Access: Level entry     Home Layout: One level Home Equipment: None      Prior Function Level of Independence: Independent               Hand Dominance        Extremity/Trunk Assessment   Upper Extremity Assessment: Generalized weakness           Lower Extremity Assessment: Generalized weakness         Communication   Communication: No difficulties  Cognition Arousal/Alertness: Awake/alert Behavior During Therapy: WFL for tasks assessed/performed Overall Cognitive Status: Within Functional Limits for tasks assessed  General Comments      Exercises     Assessment/Plan    PT Assessment Patient needs continued PT services  PT Problem List Decreased strength;Decreased range of motion;Decreased activity tolerance;Decreased balance;Decreased mobility;Decreased coordination;Decreased knowledge of use  of DME;Decreased safety awareness          PT Treatment Interventions DME instruction;Gait training;Stair training;Functional mobility training;Therapeutic activities;Therapeutic exercise;Balance training;Neuromuscular re-education;Patient/family education    PT Goals (Current goals can be found in the Care Plan section)  Acute Rehab PT Goals Patient Stated Goal: get better PT Goal Formulation: With patient Time For Goal Achievement: 01/03/16 Potential to Achieve Goals: Good    Frequency Min 3X/week   Barriers to discharge   decreased caregiver support    Co-evaluation               End of Session Equipment Utilized During Treatment: Gait belt Activity Tolerance: Patient limited by fatigue Patient left: in bed;with call bell/phone within reach Nurse Communication: Mobility status         Time: [id number] PT Time Calculation (min) (ACUTE ONLY): 25 min   Charges:   PT Evaluation $PT Eval Moderate Complexity: 1 Procedure PT Treatments $Gait Training: 8-22 mins   PT G CodesAlessandra Bevels:        Caterin Tabares M Verena Shawgo 12/20/2015, 6:21 PM Deborah ChalkJennifer Calli Bashor, PT, DPT [phone]

## 2015-12-21 ENCOUNTER — Inpatient Hospital Stay (HOSPITAL_COMMUNITY): Payer: Self-pay

## 2015-12-21 DIAGNOSIS — J410 Simple chronic bronchitis: Secondary | ICD-10-CM

## 2015-12-21 DIAGNOSIS — E871 Hypo-osmolality and hyponatremia: Secondary | ICD-10-CM

## 2015-12-21 DIAGNOSIS — N179 Acute kidney failure, unspecified: Secondary | ICD-10-CM

## 2015-12-21 LAB — BASIC METABOLIC PANEL
ANION GAP: 8 (ref 5–15)
BUN: 16 mg/dL (ref 6–20)
CHLORIDE: 104 mmol/L (ref 101–111)
CO2: 24 mmol/L (ref 22–32)
Calcium: 9.1 mg/dL (ref 8.9–10.3)
Creatinine, Ser: 0.92 mg/dL (ref 0.61–1.24)
GFR calc Af Amer: >60 mL/min (ref >60)
GFR calc non Af Amer: >60 mL/min (ref >60)
GLUCOSE: 106 mg/dL — AB (ref 65–99)
POTASSIUM: 3.8 mmol/L (ref 3.5–5.1)
Sodium: 136 mmol/L (ref 135–145)

## 2015-12-21 LAB — CBC WITH DIFFERENTIAL/PLATELET
BASOS ABS: 0 10*3/uL (ref 0.0–0.1)
Basophils Relative: 0 %
Eosinophils Absolute: 0.2 10*3/uL (ref 0.0–0.7)
Eosinophils Relative: 2 %
HEMATOCRIT: 40.3 % (ref 39.0–52.0)
HEMOGLOBIN: 13.9 g/dL (ref 13.0–17.0)
LYMPHS PCT: 16 %
Lymphs Abs: 1.3 10*3/uL (ref 0.7–4.0)
MCH: 34.4 pg — ABNORMAL HIGH (ref 26.0–34.0)
MCHC: 34.5 g/dL (ref 30.0–36.0)
MCV: 99.8 fL (ref 78.0–100.0)
MONO ABS: 0.9 10*3/uL (ref 0.1–1.0)
Monocytes Relative: 11 %
NEUTROS ABS: 5.6 10*3/uL (ref 1.7–7.7)
NEUTROS PCT: 71 %
Platelets: 245 10*3/uL (ref 150–400)
RBC: 4.04 MIL/uL — AB (ref 4.22–5.81)
RDW: 13.4 % (ref 11.5–15.5)
WBC: 7.9 10*3/uL (ref 4.0–10.5)

## 2015-12-21 MED ORDER — METHYLPREDNISOLONE SODIUM SUCC 40 MG IJ SOLR
40.0000 mg | INTRAMUSCULAR | Status: DC
  Administered 2015-12-21 – 2015-12-22 (×2): 40 mg via INTRAVENOUS
  Filled 2015-12-21 (×2): qty 1

## 2015-12-21 MED ORDER — IPRATROPIUM-ALBUTEROL 0.5-2.5 (3) MG/3ML IN SOLN: 3.0000 mL | Freq: Two times a day (BID) | RESPIRATORY_TRACT | Status: DC

## 2015-12-21 MED ORDER — DEXTROSE 5 % IV SOLN
1.0000 g | INTRAVENOUS | Status: DC
  Administered 2015-12-22 – 2015-12-24 (×3): 1 g via INTRAVENOUS
  Filled 2015-12-21 (×4): qty 10

## 2015-12-21 MED ORDER — GUAIFENESIN-DM 100-10 MG/5ML PO SYRP: 5.0000 mL | ORAL_SOLUTION | ORAL | Status: DC | PRN

## 2015-12-21 MED ORDER — IPRATROPIUM-ALBUTEROL 0.5-2.5 (3) MG/3ML IN SOLN
3.0000 mL | Freq: Four times a day (QID) | RESPIRATORY_TRACT | Status: DC
  Administered 2015-12-21: 3 mL via RESPIRATORY_TRACT
  Filled 2015-12-21: qty 3

## 2015-12-21 NOTE — Progress Notes (Addendum)
PROGRESS NOTE    George Cruz  NWG:[id number] DOB: 1955-07-12 DOA: 12/17/2015 PCP: No PCP Per Patient    Brief Narrative:  60 yo male with chronic tobacco abuse, presented with sepsis syndrome and suspected community acquired pneumonia. On admission complained of diarrhea and worsening dyspnea on exertion for the last 2 weeks. He was found dehydrated with elevated Cr and was suspected to have community acquired pneumonia. Confirmed on images from CT scan, abdomen and pelvis.   Assessment & Plan:   Principal Problem:   AKI (acute kidney injury) (HCC) Active Problems:   Hyponatremia   Metabolic acidosis   Elevated troponin   Diarrhea   CAP (community acquired pneumonia)   Essential hypertension   COPD (chronic obstructive pulmonary disease) (HCC)   Chest pain   1.AKI. Renal functiobn stable at  0.92, patient tolerating po well. Will continue to hold on IV fluids and follow a restrictive IV fluids strategy. K at 3,8 and serum bicarb at 24.  2. Sepsis with community acquired pneumonia.Continue antibiotic therapy with ceftriaxone and azithromycin, bronchodilator therapy with duonebs, will continue oxymetry monitoring and supplemental 02 per [place] to target 02 sat above 92%. Chest film improved infiltrates.    3. Sepsis.Will continue restrictive IV fluids strategy to avoid volume overload, Wbc at 7,9 Will continue to follow on blood cultures, temperature curve and cell count.  4,Diarrhea/  Positive hematochezia.Patient with history of hemorrhoids, will start patient on anusol and continue to follow hb and hct, if not improving will consult GI. Will start patient on pantoprazole for now.  Improved rectal bleeding, hb and hct stable, if recurrence or worsening will consult GI for possible endoscopic procedure. I spoke with Gastroenterology, patient will need outpatient colonoscopy. Office informed and reflected in discharge documents.  Dr Jonell Cluck.   4, Hyponatremia/ Hypokalemia.Na  continue improved up to 136, will continue to encourage po intake, continue to hold on IV fluids for now. Will replete K with 40 meq kcl, follow renal panel in am.   6. Seizures.Will resume keppra per home regimen.   7. Tobacco abuse and dyspnea, possible COPD exacerbation. Patient with persistent dyspnea and cough with increase mucous production, chronic tobacco abuse ("since 60 year old"), will add low dose systemic steroids and will bronchodilator therapy with duonebs q 6 hours. Add guaifenesin Dm for cough.   DVT prophylaxis:lovenox Code Status:full  Family Communication:no family at bedside Disposition Plan:home    Consultants:      Procedures:     Antimicrobials:   Subjective: Patient with improved dyspnea but still symptomatic on exertion with persistent cough, which is productive. No fevers, no nausea or vomiting, tolerating po well.   Objective: Vitals:   12/20/15 0500 12/20/15 1511 12/20/15 2301 12/21/15 0638  BP: (!) 139/98 (!) 112/56 135/76 (!) 116/101  Pulse: 62 74 70 63  Resp: 18 20 18 18   Temp: 98.3 F (36.8 C) 97.9 F (36.6 C) 99.5 F (37.5 C) 97.8 F (36.6 C)  TempSrc: Oral Oral Oral Oral  SpO2: 97% 100% 97% 99%  Weight:      Height:        Intake/Output Summary (Last 24 hours) at 12/21/15 1138 Last data filed at 12/21/15 0551  Gross per 24 hour  Intake              290 ml  Output                0 ml  Net  290 ml   Filed Weights   12/18/15 1330  Weight: 85.1 kg (187 lb 9.8 oz)    Examination:  General exam: deconditioned E ENT: no conjunctival pallor, oral mucosa moist.  Respiratory system: Decrease breath sounds with poor ventilation. No significant wheezing or rhonchi. Respiratory effort normal. Cardiovascular system: S1 & S2 heard, RRR. No JVD, murmurs, rubs, gallops or clicks. No pedal edema. Gastrointestinal system: Abdomen is nondistended, soft and nontender. No organomegaly or masses felt. Normal bowel sounds  heard. Central nervous system: Alert and oriented. No focal neurological deficits. Extremities: Symmetric 5 x 5 power. Skin: No rashes, lesions or ulcers    Data Reviewed: I have personally reviewed following labs and imaging studies  CBC:  Recent Labs Lab 12/17/15 2141 12/18/15 1451 12/19/15 0256 12/20/15 0621 12/21/15 0622  WBC 12.4* 9.2 8.3 8.3 7.9  NEUTROABS  --  7.1 5.7 5.4 5.6  HGB 15.6 14.4 13.6 13.1 13.9  HCT 43.9 40.8 39.0 38.1* 40.3  MCV 97.8 97.6 97.7 98.7 99.8  PLT 192 160 170 215 245   Basic Metabolic Panel:  Recent Labs Lab 12/17/15 2141 12/18/15 1451 12/19/15 0256 12/20/15 0621 12/21/15 0622  NA 124* 124* 127* 130* 136  K 3.6 3.4* 3.7 3.3* 3.8  CL 95* 98* 97* 101 104  CO2 14* 17* 20* 21* 24  GLUCOSE 134* 124* 99 112* 106*  BUN 61* 45* 32* 21* 16  CREATININE 3.43* 1.83* 1.34* 0.91 0.92  CALCIUM 9.3 8.4* 8.4* 8.7* 9.1   GFR: Estimated Creatinine Clearance: 97.7 mL/min (by C-G formula based on SCr of 0.92 mg/dL). Liver Function Tests:  Recent Labs Lab 12/18/15 1451  AST 142*  ALT 91*  ALKPHOS 62  BILITOT 0.9  PROT 7.1  ALBUMIN 3.1*   No results for input(s): LIPASE, AMYLASE in the last 168 hours. No results for input(s): AMMONIA in the last 168 hours. Coagulation Profile: No results for input(s): INR, PROTIME in the last 168 hours. Cardiac Enzymes:  Recent Labs Lab 12/18/15 0026 12/18/15 0928 12/18/15 1451 12/18/15 2020  CKTOTAL  --  1,256* 942* 708*  CKMB  --  31.8* 18.7* 11.4*  TROPONINI 0.08* <0.03 0.04*  0.06* <0.03   BNP (last 3 results) No results for input(s): PROBNP in the last 8760 hours. HbA1C: No results for input(s): HGBA1C in the last 72 hours. CBG:  Recent Labs Lab 12/18/15 0047  GLUCAP 128*   Lipid Profile: No results for input(s): CHOL, HDL, LDLCALC, TRIG, CHOLHDL, LDLDIRECT in the last 72 hours. Thyroid Function Tests: No results for input(s): TSH, T4TOTAL, FREET4, T3FREE, THYROIDAB in the last 72  hours. Anemia Panel: No results for input(s): VITAMINB12, FOLATE, FERRITIN, TIBC, IRON, RETICCTPCT in the last 72 hours. Sepsis Labs:  Recent Labs Lab 12/18/15 0056  LATICACIDVEN 0.86    Recent Results (from the past 240 hour(s))  C difficile quick scan w PCR reflex     Status: None   Collection Time: 12/18/15  2:05 AM  Result Value Ref Range Status   C Diff antigen NEGATIVE NEGATIVE Final   C Diff toxin NEGATIVE NEGATIVE Final   C Diff interpretation No C. difficile detected.  Final  Gastrointestinal Panel by PCR , Stool     Status: None   Collection Time: 12/18/15  2:05 AM  Result Value Ref Range Status   Campylobacter species NOT DETECTED NOT DETECTED Final   Plesimonas shigelloides NOT DETECTED NOT DETECTED Final   Salmonella species NOT DETECTED NOT DETECTED Final   Yersinia enterocolitica  NOT DETECTED NOT DETECTED Final   Vibrio species NOT DETECTED NOT DETECTED Final   Vibrio cholerae NOT DETECTED NOT DETECTED Final   Enteroaggregative E coli (EAEC) NOT DETECTED NOT DETECTED Final   Enteropathogenic E coli (EPEC) NOT DETECTED NOT DETECTED Final   Enterotoxigenic E coli (ETEC) NOT DETECTED NOT DETECTED Final   Shiga like toxin producing E coli (STEC) NOT DETECTED NOT DETECTED Final   E. coli O157 NOT DETECTED NOT DETECTED Final   Shigella/Enteroinvasive E coli (EIEC) NOT DETECTED NOT DETECTED Final   Cryptosporidium NOT DETECTED NOT DETECTED Final   Cyclospora cayetanensis NOT DETECTED NOT DETECTED Final   Entamoeba histolytica NOT DETECTED NOT DETECTED Final   Giardia lamblia NOT DETECTED NOT DETECTED Final   Adenovirus F40/41 NOT DETECTED NOT DETECTED Final   Astrovirus NOT DETECTED NOT DETECTED Final   Norovirus GI/GII NOT DETECTED NOT DETECTED Final   Rotavirus A NOT DETECTED NOT DETECTED Final   Sapovirus (I, II, IV, and V) NOT DETECTED NOT DETECTED Final  MRSA PCR Screening     Status: None   Collection Time: 12/18/15  1:25 PM  Result Value Ref Range Status    MRSA by PCR NEGATIVE NEGATIVE Final    Comment:        The GeneXpert MRSA Assay (FDA approved for NASAL specimens only), is one component of a comprehensive MRSA colonization surveillance program. It is not intended to diagnose MRSA infection nor to guide or monitor treatment for MRSA infections.   Culture, blood (Routine X 2) w Reflex to ID Panel     Status: None (Preliminary result)   Collection Time: 12/19/15  4:15 PM  Result Value Ref Range Status   Specimen Description BLOOD LEFT ANTECUBITAL  Final   Special Requests BOTTLES DRAWN AEROBIC AND ANAEROBIC 6CC  Final   Culture NO GROWTH < 24 HOURS  Final   Report Status PENDING  Incomplete  Culture, blood (Routine X 2) w Reflex to ID Panel     Status: None (Preliminary result)   Collection Time: 12/19/15  4:18 PM  Result Value Ref Range Status   Specimen Description BLOOD RIGHT ARM  Final   Special Requests BOTTLES DRAWN AEROBIC AND ANAEROBIC 5CC  Final   Culture NO GROWTH < 24 HOURS  Final   Report Status PENDING  Incomplete         Radiology Studies: Dg Chest 2 View  Result Date: 12/21/2015 CLINICAL DATA:  Pt reports mid-sternal chest pain and dizziness x 2 days; he reports chronic cough and h/o PNA and HTN; smoker EXAM: CHEST  2 VIEW COMPARISON:  12/18/2015 FINDINGS: Cardiac silhouette is normal in size and configuration. The aorta is tortuous. No mediastinal or hilar masses or evidence of adenopathy. Clear lungs.  No pleural effusion or pneumothorax. Bony thorax is demineralized but intact. IMPRESSION: No active cardiopulmonary disease Electronically Signed   By: Amie Portland M.D.   On: 12/21/2015 07:55        Scheduled Meds: . azithromycin  500 mg Oral Q24H  . enoxaparin (LOVENOX) injection  40 mg Subcutaneous Q24H  . hydrocortisone  25 mg Rectal BID  . levETIRAcetam  500 mg Oral Daily  . methylPREDNISolone (SOLU-MEDROL) injection  40 mg Intravenous Q24H  . pantoprazole  40 mg Oral Daily   Continuous  Infusions:    LOS: 3 days       Chonita Gadea Annett Gula, MD [facility] Pager [phone]  If 7PM-7AM, please contact night-coverage [email or web address] Password [email or web address] 12/21/2015, 11:38 AM

## 2015-12-22 ENCOUNTER — Encounter (HOSPITAL_COMMUNITY): Payer: Self-pay | Admitting: Physician Assistant

## 2015-12-22 DIAGNOSIS — R7989 Other specified abnormal findings of blood chemistry: Secondary | ICD-10-CM

## 2015-12-22 DIAGNOSIS — K591 Functional diarrhea: Secondary | ICD-10-CM

## 2015-12-22 DIAGNOSIS — K922 Gastrointestinal hemorrhage, unspecified: Secondary | ICD-10-CM

## 2015-12-22 DIAGNOSIS — J42 Unspecified chronic bronchitis: Secondary | ICD-10-CM

## 2015-12-22 DIAGNOSIS — J189 Pneumonia, unspecified organism: Secondary | ICD-10-CM

## 2015-12-22 DIAGNOSIS — K921 Melena: Secondary | ICD-10-CM

## 2015-12-22 LAB — BASIC METABOLIC PANEL
Anion gap: 8 (ref 5–15)
BUN: 14 mg/dL (ref 6–20)
CALCIUM: 9 mg/dL (ref 8.9–10.3)
CHLORIDE: 105 mmol/L (ref 101–111)
CO2: 24 mmol/L (ref 22–32)
CREATININE: 0.87 mg/dL (ref 0.61–1.24)
GFR calc Af Amer: >60 mL/min (ref >60)
GFR calc non Af Amer: >60 mL/min (ref >60)
Glucose, Bld: 114 mg/dL — ABNORMAL HIGH (ref 65–99)
Potassium: 3.7 mmol/L (ref 3.5–5.1)
SODIUM: 137 mmol/L (ref 135–145)

## 2015-12-22 LAB — CBC WITH DIFFERENTIAL/PLATELET
Basophils Absolute: 0 10*3/uL (ref 0.0–0.1)
Basophils Relative: 0 %
EOS ABS: 0.2 10*3/uL (ref 0.0–0.7)
Eosinophils Relative: 1 %
HCT: 39.3 % (ref 39.0–52.0)
HEMOGLOBIN: 13.2 g/dL (ref 13.0–17.0)
LYMPHS ABS: 1.7 10*3/uL (ref 0.7–4.0)
Lymphocytes Relative: 14 %
MCH: 33.8 pg (ref 26.0–34.0)
MCHC: 33.6 g/dL (ref 30.0–36.0)
MCV: 100.8 fL — ABNORMAL HIGH (ref 78.0–100.0)
MONO ABS: 0.7 10*3/uL (ref 0.1–1.0)
MONOS PCT: 6 %
NEUTROS PCT: 79 %
Neutro Abs: 9.4 10*3/uL — ABNORMAL HIGH (ref 1.7–7.7)
Platelets: 273 10*3/uL (ref 150–400)
RBC: 3.9 MIL/uL — ABNORMAL LOW (ref 4.22–5.81)
RDW: 14.1 % (ref 11.5–15.5)
WBC: 11.9 10*3/uL — ABNORMAL HIGH (ref 4.0–10.5)

## 2015-12-22 LAB — LEGIONELLA PNEUMOPHILA SEROGP 1 UR AG: L. PNEUMOPHILA SEROGP 1 UR AG: NEGATIVE

## 2015-12-22 MED ORDER — SODIUM CHLORIDE 0.9 % IV SOLN
INTRAVENOUS | Status: DC
  Administered 2015-12-22: 15:00:00 via INTRAVENOUS

## 2015-12-22 MED ORDER — IPRATROPIUM-ALBUTEROL 0.5-2.5 (3) MG/3ML IN SOLN
3.0000 mL | Freq: Four times a day (QID) | RESPIRATORY_TRACT | Status: DC
  Administered 2015-12-22 (×2): 3 mL via RESPIRATORY_TRACT
  Filled 2015-12-22 (×2): qty 3

## 2015-12-22 MED ORDER — METHYLPREDNISOLONE SODIUM SUCC 40 MG IJ SOLR
40.0000 mg | Freq: Two times a day (BID) | INTRAMUSCULAR | Status: DC
  Administered 2015-12-23 – 2015-12-24 (×3): 40 mg via INTRAVENOUS
  Filled 2015-12-22 (×3): qty 1

## 2015-12-22 MED ORDER — SODIUM CHLORIDE 0.9 % IV SOLN: INTRAVENOUS | Status: AC

## 2015-12-22 NOTE — Care Management Note (Addendum)
Case Management Note  Patient Details  Name: George GulaRalph Cruz MRN: [id number] Date of Birth: 02/27/1956  Subjective/Objective:                 Patient form home, independent prior to admission, lives with roommates. Recently moved from [place] where he states he had medicaid. CM reviewed with patient to go to DSS to get [place] medicaid switched over to [place]. He verbalized understanding. Daughter lives in town and will provide transportation. Admitted with sepsis from PNA, DOE, continues IV Abx and steroids. No PCP or insurance. [facility]. CM explained to patient that they may use the on site pharmacy to fill prescriptions given to them at discharge. Patient aware that the [facility] will not fill narcotics or pain medications prior to the patient being seen by one of their physicians.  Patient aware that they must be seen as a patient prior to the pharmacy filling the prescriptions a second time. Currently [facility] does not have open appointmnets for this week and patient instructed to call and schedule appointment after discharge.    Action/Plan:  DC to home self care.   Expected Discharge Date:                  Expected Discharge Plan:  Home/Self Care ([facility] not qualify [facility])  In-House Referral:  NA  Discharge planning Services  CM Consult  Post Acute Care Choice:    Choice offered to:     DME Arranged:    DME Agency:     HH Arranged:    HH Agency:     Status of Service:  In process, will continue to follow  If discussed at Long Length of Stay Meetings, dates discussed:    Additional Comments:  Lawerance SabalDebbie Markesia Crilly, RN 12/22/2015, 3:53 PM

## 2015-12-22 NOTE — Consult Note (Signed)
[facility] Consult: 2:22 PM 12/22/2015  LOS: 4 days    Referring Provider: Dr Marland Mcalpine  Primary Care Physician:  No PCP Per Patient Primary Gastroenterologist:  unassigned     Reason for Consultation:  Passing blood per rectum.   HPI: George Cruz is a 60 y.o. male.  PMH HTN.  COPD.  Seizures.  Anemia.  Diabetes mellitus, type II on no meds for this.  S/p hemorrhoidectomy in the 1980s or 1990s, this was done in [place].   Despite the hemorrhoid surgery, the patient has continued to have intermittent minor to moderate rectal bleeding which can occur a few times a year or less than once yearly.  He had a colonoscopy just before undergoing the hemorrhoidectomy and does not recall any polyps.   For perhaps as long as 18 months, the patient has had anorexia.  He also hasn't had a formed stool for a year or 2. For almost 3 weeks, the patient has had continuously bloody, loose stools up to 5 times per day.Marland Kitchen He really doesn't see much fecal material, just red blood sitting in the bottom of the commode.  He has never had melenic type stool. Anorexia has worsened.   Patient was admitted 4 days ago complaining of malaise. . Only more recently had he developed the weakness along with shortness of breath.  By mouth intake exacerbated the stool frequency, so he wasn't eating or drinking much.   The initial hemoglobin was 16.1 with MCV at 101. Hemoglobin has drifted down steadily to between 13.1 and 13.9 in the last 3 days. It is 13.2 today.  Platelets are well into the 200s.  Stool tests FOBT positive.  AKI has resolved. Total CK (1256 max) as well as CKMB (31) elevated but improving. Troponins maxed at 0.08 but have trended down and are now normal.   12/18/2015 non-contrasted CT abdomen pelvis:  Right lung opacity, concerning  for pneumonia. Nonspecific urinary bladder wall thickening. Nonobstructing, small, bilateral kidney stones without hydronephrosis. Sigmoid diverticulosis, mild and no diverticulitis. Chronic spinal changes in L5-S1. Patient was treated empirically with hydrocortisone suppositories and received these once on 9/30 and again this morning. He continues to pass blood. Patient has multiple tattoos.  He got his first tattoo at age 28. Although the tattoos are well executed, he says that these were done by nonprofessional tattoo artist's. He denies knowledge of liver disease or any hepatitis. He has tested HIV -3 days ago but has never undergone hepatitis B or C testing as far as he knows.  Up until about a month ago, he was drinking up to 3 or 4 32 oz cans of beer daily.  The last cigarette he smoked was on the day of admission. He denies use of IV drugs or illicit medications. 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 He still feels unwell. He is not specifically having trouble breathing but does have a productive cough.   Past Medical History:  Diagnosis Date  . Anemia   . COPD (chronic obstructive  pulmonary disease) (HCC)   . Diabetes mellitus without complication (HCC)   . Hypertension   . Seizures (HCC)     Past Surgical History:  Procedure Laterality Date  . EXCISIONAL HEMORRHOIDECTOMY    . head surgery      Prior to Admission medications   Medication Sig Start Date End Date Taking? Authorizing Provider  amLODipine (NORVASC) 10 MG tablet Take 1 tablet (10 mg total) by mouth daily. Patient not taking: Reported on 12/18/2015 07/03/15   Tomasita Crumble, MD  levETIRAcetam (KEPPRA) 500 MG tablet Take 1 tablet (500 mg total) by mouth daily. Patient not taking: Reported on 12/18/2015 07/03/15   Tomasita Crumble, MD  traMADol (ULTRAM) 50 MG tablet Take 1 tablet (50 mg total) by mouth every 12 (twelve) hours as needed. Patient not taking: Reported on 12/18/2015 07/03/15    Tomasita Crumble, MD    Scheduled Meds: . azithromycin  500 mg Oral Q24H  . cefTRIAXone (ROCEPHIN)  IV  1 g Intravenous Q24H  . hydrocortisone  25 mg Rectal BID  . ipratropium-albuterol  3 mL Nebulization Q6H  . levETIRAcetam  500 mg Oral Daily  . methylPREDNISolone (SOLU-MEDROL) injection  40 mg Intravenous Q24H  . pantoprazole  40 mg Oral Daily   Infusions: . sodium chloride     PRN Meds: acetaminophen **OR** acetaminophen, guaiFENesin-dextromethorphan, ipratropium-albuterol, ondansetron **OR** ondansetron (ZOFRAN) IV   Allergies as of 12/17/2015  . (No Known Allergies)    Family History  Problem Relation Age of Onset  . Stroke Mother   . Cancer Father     Social History   Social History  . Marital status: Single    Spouse name: N/A  . Number of children: N/A  . Years of education: N/A   Occupational History  . Not on file.   Social History Main Topics  . Smoking status: Current Some Day Smoker    Packs/day: 1.00    Types: Cigarettes  . Smokeless tobacco: Never Used  . Alcohol use Yes     Comment: 5 days/week  . Drug use: No  . Sexual activity: Not on file   Other Topics Concern  . Not on file   Social History Narrative  . No narrative on file    REVIEW OF SYSTEMS: Constitutional:  At his maximum weight the patient weighed 257 #, in fact weight recorded in April 2017 was 240#.  His weight as of 3 days ago was 187#.  So he has lost a significant amount of weight. ENT:  No nose bleeds Pulm:  Per HPIO CV:  No palpitations, no LE edema.  GU:  No hematuria, no frequency GI:  No dysphagia. No nausea or vomiting. Other details noted in history of present illness. Heme:  No previous issues with anemia   Transfusions:  Has never undergone blood transfusion Neuro:  No headaches, no peripheral tingling or numbness Derm:  No itching, no rash or sores.  Endocrine:  No sweats or chills.  No polyuria or dysuria Immunization:  Did not inquire. Travel:  None beyond  local counties in last few months.    PHYSICAL EXAM: Vital signs in last 24 hours: Vitals:   12/22/15 0517 12/22/15 1351  BP: 131/78 129/78  Pulse: (!) 53 74  Resp: 17 20  Temp: 97.4 F (36.3 C) 97.8 F (36.6 C)   Wt Readings from Last 3 Encounters:  12/18/15 85.1 kg (187 lb 9.8 oz)  07/03/15 108.9 kg (240 lb)   General: Tanned, non-ill appearing man who  appears his stated age. He is comfortable Head:  No facial asymmetry, edema or signs of head trauma.  Eyes:  No scleral icterus. No conjunctival pallor. EOMI Ears:  Slightly hard of hearing.  Nose:  No discharge or congestion Mouth:  Dental caries present and generally poor dentition. Oral mucosa is moist and clear. Neck:  No masses, no JVD, no TMG. Lungs:  Loose cough present. No dyspnea. Reduced breath sounds on the right Heart: RRR.  Very soft 1/6 systolic murmur. No rubs or gallops. S1, S2 present. Abdomen:  Soft. Nonobese.  NT.  ND.  No HSM, masses, hernias or bruits..   Rectal: Rectal exam was deferred.   Musc/Skeltl: No joint contractures or deformities, redness or swelling Extremities:  No CCE  Neurologic:  Patient is alert. Oriented 3. No limb weakness or tremor. No gross neurologic deficits. Skin:  Tanned. No obvious sores or rashes. No telangiectasia. Tattoos:  Extensive tattoos on the torso both front and back, as well as both arms. Nodes:  No cervical adenopathy.   Psych:  Pleasant, calm, cooperative. Not anxious, agitated or obviously depressed.  Intake/Output from previous day: 10/01 0701 - 10/02 0700 In: 290 [P.O.:240; IV Piggyback:50] Out: 2 [Stool:2] Intake/Output this shift: No intake/output data recorded.  LAB RESULTS:  Recent Labs  12/20/15 0621 12/21/15 0622 12/22/15 1047  WBC 8.3 7.9 11.9*  HGB 13.1 13.9 13.2  HCT 38.1* 40.3 39.3  PLT 215 245 273   BMET Lab Results  Component Value Date   NA 137 12/22/2015   NA 136 12/21/2015   NA 130 (L) 12/20/2015   K 3.7 12/22/2015   K 3.8  12/21/2015   K 3.3 (L) 12/20/2015   CL 105 12/22/2015   CL 104 12/21/2015   CL 101 12/20/2015   CO2 24 12/22/2015   CO2 24 12/21/2015   CO2 21 (L) 12/20/2015   GLUCOSE 114 (H) 12/22/2015   GLUCOSE 106 (H) 12/21/2015   GLUCOSE 112 (H) 12/20/2015   BUN 14 12/22/2015   BUN 16 12/21/2015   BUN 21 (H) 12/20/2015   CREATININE 0.87 12/22/2015   CREATININE 0.92 12/21/2015   CREATININE 0.91 12/20/2015   CALCIUM 9.0 12/22/2015   CALCIUM 9.1 12/21/2015   CALCIUM 8.7 (L) 12/20/2015   LFT No results for input(s): PROT, ALBUMIN, AST, ALT, ALKPHOS, BILITOT, BILIDIR, IBILI in the last 72 hours. PT/INR No results found for: INR, PROTIME Hepatitis Panel No results for input(s): HEPBSAG, HCVAB, HEPAIGM, HEPBIGM in the last 72 hours. C-Diff No components found for: CDIFF Lipase  No results found for: LIPASE  Drugs of Abuse     Component Value Date/Time   LABOPIA POSITIVE (A) 09/02/2013 0246   COCAINSCRNUR NONE DETECTED 09/02/2013 0246   LABBENZ NONE DETECTED 09/02/2013 0246   AMPHETMU NONE DETECTED 09/02/2013 0246   THCU NONE DETECTED 09/02/2013 0246   LABBARB NONE DETECTED 09/02/2013 0246     RADIOLOGY STUDIES: Dg Chest 2 View  Result Date: 12/21/2015 CLINICAL DATA:  Pt reports mid-sternal chest pain and dizziness x 2 days; he reports chronic cough and h/o PNA and HTN; smoker EXAM: CHEST  2 VIEW COMPARISON:  12/18/2015 FINDINGS: Cardiac silhouette is normal in size and configuration. The aorta is tortuous. No mediastinal or hilar masses or evidence of adenopathy. Clear lungs.  No pleural effusion or pneumothorax. Bony thorax is demineralized but intact. IMPRESSION: No active cardiopulmonary disease Electronically Signed   By: Amie Portland M.D.   On: 12/21/2015 07:55    ENDOSCOPIC STUDIES: Per  HPI  IMPRESSION:   *  Exacerbation of previously intermittent rectal bleeding. Now chronic for the last 3 weeks. Patient hasn't had a formed bowel movement in perhaps as long as 18 months.  Though the non-contrasted CT scan is not worrisome, his symptoms and weight loss are worrisome for colonic neoplasm.  He needs a colonoscopy.  Previous, remote, hemorrhoidectomy did not provide resolution to the rectal bleeding.  *  Community acquired pneumonia and possible COPD flare. .  On Rocephin, methyl prednisolone, azithromycin and nebulizers.  *  Significant alcohol intake, abstinent for about a month. No evidence for liver disease, portal hypertension etc. on the CT scan. Elevated transaminases with AST > ALT raising concern for alcoholic hepatitis. However not sure how reliable these transaminases are in the setting of acute kidney injury and rhabdo. Given multiple tattoos dating back 5 decades, should have Hep B and C testing.   *  Rhabdomyolysis (elevated CK and CKMB) with AKI.Marland Kitchen. Renal function has normalized. CK and CK-MB improving.    PLAN:     *  Hepatitis B and C serologies ordered for tomorrow morning. Dr. Lavon PaganiniNandigam to decide timing of patient's colonoscopy, however this should be done before he leaves the hospital. We may want to give another 24 hours for the pneumonia and resp status to improve.   Jennye MoccasinSarah Anesa Fronek  12/22/2015, 2:22 PM Pager: [phone]

## 2015-12-22 NOTE — Progress Notes (Signed)
Physical Therapy Treatment Patient Details Name: George GulaRalph Boniface MRN: [id number] DOB: 04/13/1955 Today's Date: 12/22/2015    History of Present Illness Pt is a 60 y/o male admitted secondary to AKI, hyponatremia and diarrhea. PMH including but not limited to DM, HTN, COPD, seizures and colon cancer.    PT Comments    Pt limited by c/o bilat LE tingling/increased weakness with gait. Tolerated short distance gait this session. Pt appeared confused at times and demonstrated lack of insight into deficits and need for AD. Continue to progress as tolerated.   Follow Up Recommendations  Home health PT;Supervision for mobility/OOB     Equipment Recommendations  Cane    Recommendations for Other Services       Precautions / Restrictions Precautions Precautions: Fall Restrictions Weight Bearing Restrictions: No    Mobility  Bed Mobility Overal bed mobility: Needs Assistance Bed Mobility: Sit to Supine       Sit to supine: Supervision   General bed mobility comments: supervision for safety and increased time; cues for positioning in bed; pt easily irritated and asked "what are you doing? why are you doing that?" when therapist flattended bed and repositioned pillows; when explained that it would be easier to position in bed this way pt appeared confused   Transfers Overall transfer level: Needs assistance Equipment used: Straight cane Transfers: Sit to/from Stand Sit to Stand: Min guard         General transfer comment: from EOB; cues for safe hand placement  Ambulation/Gait Ambulation/Gait assistance: Min guard Ambulation Distance (Feet): 28 Feet Assistive device: Straight cane Gait Pattern/deviations: Step-through pattern;Decreased stride length;Trunk flexed Gait velocity: decreased   General Gait Details: slow, guarded movements; cues for posture and sequencing/safe use of AD; pt held onto rail in hallway despite cues to attempt gait with only use of SPC; pt reported  tingling in bilat LEs and began turning around to go back into room; pt aware of  possibility of falling when bilat LE become weak however denies need for AD   Stairs            Wheelchair Mobility    Modified Rankin (Stroke Patients Only)       Balance     Sitting balance-Leahy Scale: Fair       Standing balance-Leahy Scale: Fair                      Cognition Arousal/Alertness: Awake/alert Behavior During Therapy: WFL for tasks assessed/performed Overall Cognitive Status: No family/caregiver present to determine baseline cognitive functioning Area of Impairment: Safety/judgement;Awareness;Memory;Problem solving     Memory: Decreased recall of precautions   Safety/Judgement: Decreased awareness of safety;Decreased awareness of deficits Awareness: Emergent Problem Solving: Difficulty sequencing;Slow processing General Comments: pt with confusion and with lack of insight into deficits/safety and need for AD although pt is aware of bilat LE weakness    Exercises      General Comments General comments (skin integrity, edema, etc.): pt declined sitting up in recliner end of session; educated on benefits of being OOB       Pertinent Vitals/Pain Pain Assessment: No/denies pain    Home Living                      Prior Function            PT Goals (current goals can now be found in the care plan section) Acute Rehab PT Goals Patient Stated Goal: get better PT Goal Formulation:  With patient Time For Goal Achievement: 01/03/16 Potential to Achieve Goals: Good Progress towards PT goals: Progressing toward goals    Frequency    Min 3X/week      PT Plan Current plan remains appropriate    Co-evaluation             End of Session Equipment Utilized During Treatment: Gait belt Activity Tolerance: Patient limited by fatigue Patient left: in bed;with call bell/phone within reach     Time: 1124-1144 PT Time Calculation (min)  (ACUTE ONLY): 20 min  Charges:  $Gait Training: 8-22 mins                    G Codes:      Derek Mound, PTA Pager: [phone]   12/22/2015, 11:59 AM

## 2015-12-22 NOTE — Progress Notes (Signed)
PROGRESS NOTE    George Cruz  ZOX:[id number] DOB: [birth year] DOA: 12/17/2015 PCP: No PCP Per Patient   Brief Narrative: The patient is a 60 yo male with chronic tobacco abuse, presented with sepsis syndrome and suspected community acquired pneumonia. On admission complained of diarrhea and worsening dyspnea on exertion for the last 2 weeks. He was found dehydrated with elevated Cr and was suspected to have community acquired pneumonia. Also complained of worsening diarrhea. Confirmed on images from CT scan, abdomen and pelvis. He started having Gross Bloody Diarrhea recently and GI was consulted for Evaluation.   Assessment & Plan:   Principal Problem:   AKI (acute kidney injury) (HCC) Active Problems:   Hyponatremia   Metabolic acidosis   Elevated troponin   Diarrhea   CAP (community acquired pneumonia)   Essential hypertension   COPD (chronic obstructive pulmonary disease) (HCC)   Chest pain  ASSESSMENTS  1. Sepsis 2/2 to Community Acquired PNA, poA Improving 2. Community Acquired PNA with likely COPD Exacerbation 3. AKI from Dehydration, Sepsis and Diarrhea 4. Diarrhea with Hematochezia/BRBPR 5. Seizure Disorder 6. Longstanding Tobacco Abuse 7. Hx of Heavy EtOH Use  PLAN  1. Sepsis 2/2 to Community Acquired Right Lung PNA, poA Improving -Continue antibiotic therapy with Ceftriaxone IV and Azithromycin po; Increased bronchodilator therapy with Duonebs from BID to q6h. C/w IV Steroids 40 Methylprednisolone. Will continue oxymetry monitoring and supplemental 02 per [place] to target 02 sat above 92%. C/w Robituissin Dm. Chest Xray showed improved infiltrates. Strep Pnuemo and Legionalla Urine Antigens Negative. WBC went from 7.9 -> 11.9. Continue to Monitor and repeat CBC in Am. Acetaminophen for Fevers. Restarted Fluids at 50 mL/hr for 10 hours.   2. Community Acquired PNA with likely COPD Exacerbation -As in #1  3. AKI from Dehydration, Sepsis and Diarrhea, Improved.  -Mild  Rhabdo improved.  -Patient still having significant Diarrhea/ BRBPR and Hematochezia. Will start NS at a rate of 50 mL/hr for 10 hours. BUN/Cr went from 16/0.92 -> 14/0.87. Repeat CMP in Am.   4. Diarrhea with Hematochezia/BRBPR  -Hx of Hemorrhoids -Consulted Gi. Appreciate Reccs and Evaluation. Continue with Protonix and Anusol. Colonoscopy once Respiratory Status is improving. Monitor H/H.  5. Seizure Disorder -Contine Keppra XR 500 mg po Daily   6. Longstanding Tobacco Abuse -Smoking Cessation counseling given.   7. Hx of Significant EtOH Use -Check Hepatitis Labs per GI. Patient has elevated AST (142) > ALT (91). ? EtOH Hepatitis; Repeat CMP in AM  DVT prophylaxis: Lovenox Code Status: FULL Family Communication: No Family present during encounter. Disposition Plan: Home vs. SNF at D/C  Consultants:   -[facility]  Procedures: Possible Colonoscopy Antimicrobials: Ceftriaxone and Azithromycin  Subjective:  Patient was seen and examined at bedside today and he stated he was still having gross bloodly bowel movements. Still had SOB which was chronic. Denied Cp. Had some N but no vomiting. No other complaints or concerns and is appreciative of GI Consultation.   Objective: Vitals:   12/21/15 2155 12/22/15 0517 12/22/15 1331 12/22/15 1351  BP: 126/75 131/78  129/78  Pulse: 65 (!) 53  74  Resp: 17 17  20   Temp: 97.4 F (36.3 C) 97.4 F (36.3 C)  97.8 F (36.6 C)  TempSrc: Oral Oral  Oral  SpO2: 96% 97% 97% 95%  Weight:      Height:        Intake/Output Summary (Last 24 hours) at 12/22/15 1825 Last data filed at 12/22/15 1700  Gross per 24  hour  Intake           471.25 ml  Output                2 ml  Net           469.25 ml   Filed Weights   12/18/15 1330  Weight: 85.1 kg (187 lb 9.8 oz)    Examination: Physical Exam:  Constitutional: WN/WD, NAD and appears mildly distressed with SOB.  Eyes:  Lids and conjunctivae normal, sclerae anicteric  ENMT: External  Ears, Nose appear normal. Grossly normal hearing. Mucous membranes are moist.  Neck: Appears normal, supple, no cervical masses, normal ROM, no appreciable thyromegaly, no JVD.  Respiratory: Diminished breath sounds with mild wheezing. Mild Right Crackles. No accessory muscle use.Patient not tachypenic on exam.  Cardiovascular: RRR, no murmurs / rubs / gallops. S1 and S2 auscultated. No extremity edema. 2+ pedal pulses. No carotid bruits.  Abdomen: Soft, non-tender, non-distended. No masses palpated. No appreciable hepatosplenomegaly. Bowel sounds positive x4.  GU: Deferred. Musculoskeletal: No clubbing / cyanosis of digits/nails. No joint deformity upper and lower extremities. Good ROM, no contractures. Normal strength and muscle tone.  Skin: No rashes, lesions, ulcers. No induration; Warm and dry. Diffusely tattooed all over body.  Neurologic: CN 2-12 grossly intact with no focal deficits. Sensation intact in all 4 Extremities, DTR normal. Strength 5/5 in all 4. Romberg sign cerebellar reflexes not assessed.  Psychiatric: Normal judgment and insight. Alert and oriented x 3. Normal mood and appropriate affect.   Data Reviewed: I have reviewed the following.   CBC:  Recent Labs Lab 12/18/15 1451 12/19/15 0256 12/20/15 0621 12/21/15 0622 12/22/15 1047  WBC 9.2 8.3 8.3 7.9 11.9*  NEUTROABS 7.1 5.7 5.4 5.6 9.4*  HGB 14.4 13.6 13.1 13.9 13.2  HCT 40.8 39.0 38.1* 40.3 39.3  MCV 97.6 97.7 98.7 99.8 100.8*  PLT 160 170 215 245 273   Basic Metabolic Panel:  Recent Labs Lab 12/18/15 1451 12/19/15 0256 12/20/15 0621 12/21/15 0622 12/22/15 1047  NA 124* 127* 130* 136 137  K 3.4* 3.7 3.3* 3.8 3.7  CL 98* 97* 101 104 105  CO2 17* 20* 21* 24 24  GLUCOSE 124* 99 112* 106* 114*  BUN 45* 32* 21* 16 14  CREATININE 1.83* 1.34* 0.91 0.92 0.87  CALCIUM 8.4* 8.4* 8.7* 9.1 9.0   GFR: Estimated Creatinine Clearance: 103.3 mL/min (by C-G formula based on SCr of 0.87 mg/dL). Liver Function  Tests:  Recent Labs Lab 12/18/15 1451  AST 142*  ALT 91*  ALKPHOS 62  BILITOT 0.9  PROT 7.1  ALBUMIN 3.1*   No results for input(s): LIPASE, AMYLASE in the last 168 hours. No results for input(s): AMMONIA in the last 168 hours. Coagulation Profile: No results for input(s): INR, PROTIME in the last 168 hours. Cardiac Enzymes:  Recent Labs Lab 12/18/15 0026 12/18/15 0928 12/18/15 1451 12/18/15 2020  CKTOTAL  --  1,256* 942* 708*  CKMB  --  31.8* 18.7* 11.4*  TROPONINI 0.08* <0.03 0.04*  0.06* <0.03   BNP (last 3 results) No results for input(s): PROBNP in the last 8760 hours. HbA1C: No results for input(s): HGBA1C in the last 72 hours. CBG:  Recent Labs Lab 12/18/15 0047  GLUCAP 128*   Lipid Profile: No results for input(s): CHOL, HDL, LDLCALC, TRIG, CHOLHDL, LDLDIRECT in the last 72 hours. Thyroid Function Tests: No results for input(s): TSH, T4TOTAL, FREET4, T3FREE, THYROIDAB in the last 72 hours. Anemia Panel: No  results for input(s): VITAMINB12, FOLATE, FERRITIN, TIBC, IRON, RETICCTPCT in the last 72 hours. Sepsis Labs:  Recent Labs Lab 12/18/15 0056  LATICACIDVEN 0.86    Recent Results (from the past 240 hour(s))  C difficile quick scan w PCR reflex     Status: None   Collection Time: 12/18/15  2:05 AM  Result Value Ref Range Status   C Diff antigen NEGATIVE NEGATIVE Final   C Diff toxin NEGATIVE NEGATIVE Final   C Diff interpretation No C. difficile detected.  Final  Gastrointestinal Panel by PCR , Stool     Status: None   Collection Time: 12/18/15  2:05 AM  Result Value Ref Range Status   Campylobacter species NOT DETECTED NOT DETECTED Final   Plesimonas shigelloides NOT DETECTED NOT DETECTED Final   Salmonella species NOT DETECTED NOT DETECTED Final   Yersinia enterocolitica NOT DETECTED NOT DETECTED Final   Vibrio species NOT DETECTED NOT DETECTED Final   Vibrio cholerae NOT DETECTED NOT DETECTED Final   Enteroaggregative E coli (EAEC) NOT  DETECTED NOT DETECTED Final   Enteropathogenic E coli (EPEC) NOT DETECTED NOT DETECTED Final   Enterotoxigenic E coli (ETEC) NOT DETECTED NOT DETECTED Final   Shiga like toxin producing E coli (STEC) NOT DETECTED NOT DETECTED Final   E. coli O157 NOT DETECTED NOT DETECTED Final   Shigella/Enteroinvasive E coli (EIEC) NOT DETECTED NOT DETECTED Final   Cryptosporidium NOT DETECTED NOT DETECTED Final   Cyclospora cayetanensis NOT DETECTED NOT DETECTED Final   Entamoeba histolytica NOT DETECTED NOT DETECTED Final   Giardia lamblia NOT DETECTED NOT DETECTED Final   Adenovirus F40/41 NOT DETECTED NOT DETECTED Final   Astrovirus NOT DETECTED NOT DETECTED Final   Norovirus GI/GII NOT DETECTED NOT DETECTED Final   Rotavirus A NOT DETECTED NOT DETECTED Final   Sapovirus (I, II, IV, and V) NOT DETECTED NOT DETECTED Final  MRSA PCR Screening     Status: None   Collection Time: 12/18/15  1:25 PM  Result Value Ref Range Status   MRSA by PCR NEGATIVE NEGATIVE Final    Comment:        The GeneXpert MRSA Assay (FDA approved for NASAL specimens only), is one component of a comprehensive MRSA colonization surveillance program. It is not intended to diagnose MRSA infection nor to guide or monitor treatment for MRSA infections.   Culture, blood (Routine X 2) w Reflex to ID Panel     Status: None (Preliminary result)   Collection Time: 12/19/15  4:15 PM  Result Value Ref Range Status   Specimen Description BLOOD LEFT ANTECUBITAL  Final   Special Requests BOTTLES DRAWN AEROBIC AND ANAEROBIC 6CC  Final   Culture NO GROWTH 3 DAYS  Final   Report Status PENDING  Incomplete  Culture, blood (Routine X 2) w Reflex to ID Panel     Status: None (Preliminary result)   Collection Time: 12/19/15  4:18 PM  Result Value Ref Range Status   Specimen Description BLOOD RIGHT ARM  Final   Special Requests BOTTLES DRAWN AEROBIC AND ANAEROBIC 5CC  Final   Culture NO GROWTH 3 DAYS  Final   Report Status PENDING   Incomplete    Radiology Studies: Dg Chest 2 View  Result Date: 12/21/2015 CLINICAL DATA:  Pt reports mid-sternal chest pain and dizziness x 2 days; he reports chronic cough and h/o PNA and HTN; smoker EXAM: CHEST  2 VIEW COMPARISON:  12/18/2015 FINDINGS: Cardiac silhouette is normal in size and configuration. The aorta  is tortuous. No mediastinal or hilar masses or evidence of adenopathy. Clear lungs.  No pleural effusion or pneumothorax. Bony thorax is demineralized but intact. IMPRESSION: No active cardiopulmonary disease Electronically Signed   By: Amie Portland M.D.   On: 12/21/2015 07:55   Scheduled Meds: . azithromycin  500 mg Oral Q24H  . cefTRIAXone (ROCEPHIN)  IV  1 g Intravenous Q24H  . hydrocortisone  25 mg Rectal BID  . ipratropium-albuterol  3 mL Nebulization Q6H  . levETIRAcetam  500 mg Oral Daily  . methylPREDNISolone (SOLU-MEDROL) injection  40 mg Intravenous Q24H  . pantoprazole  40 mg Oral Daily   Continuous Infusions: . sodium chloride      LOS: 4 days    Merlene Laughter, DO [facility] Pager [phone]  If 7PM-7AM, please contact night-coverage [email or web address] Password [facility] 12/22/2015, 6:25 PM

## 2015-12-23 DIAGNOSIS — K625 Hemorrhage of anus and rectum: Secondary | ICD-10-CM

## 2015-12-23 DIAGNOSIS — R768 Other specified abnormal immunological findings in serum: Secondary | ICD-10-CM

## 2015-12-23 DIAGNOSIS — R197 Diarrhea, unspecified: Secondary | ICD-10-CM

## 2015-12-23 LAB — CBC WITH DIFFERENTIAL/PLATELET
Basophils Absolute: 0 10*3/uL (ref 0.0–0.1)
Basophils Relative: 0 %
EOS PCT: 1 %
Eosinophils Absolute: 0.1 10*3/uL (ref 0.0–0.7)
HCT: 38.1 % — ABNORMAL LOW (ref 39.0–52.0)
Hemoglobin: 12.7 g/dL — ABNORMAL LOW (ref 13.0–17.0)
LYMPHS ABS: 0.9 10*3/uL (ref 0.7–4.0)
LYMPHS PCT: 7 %
MCH: 33.7 pg (ref 26.0–34.0)
MCHC: 33.3 g/dL (ref 30.0–36.0)
MCV: 101.1 fL — AB (ref 78.0–100.0)
MONO ABS: 0.6 10*3/uL (ref 0.1–1.0)
MONOS PCT: 4 %
Neutro Abs: 11.8 10*3/uL — ABNORMAL HIGH (ref 1.7–7.7)
Neutrophils Relative %: 88 %
PLATELETS: 281 10*3/uL (ref 150–400)
RBC: 3.77 MIL/uL — AB (ref 4.22–5.81)
RDW: 14.2 % (ref 11.5–15.5)
WBC: 13.3 10*3/uL — ABNORMAL HIGH (ref 4.0–10.5)

## 2015-12-23 LAB — COMPREHENSIVE METABOLIC PANEL
ALBUMIN: 2.8 g/dL — AB (ref 3.5–5.0)
ALT: 208 U/L — AB (ref 17–63)
AST: 179 U/L — AB (ref 15–41)
Alkaline Phosphatase: 100 U/L (ref 38–126)
Anion gap: 5 (ref 5–15)
BILIRUBIN TOTAL: 0.5 mg/dL (ref 0.3–1.2)
BUN: 12 mg/dL (ref 6–20)
CHLORIDE: 105 mmol/L (ref 101–111)
CO2: 25 mmol/L (ref 22–32)
CREATININE: 0.8 mg/dL (ref 0.61–1.24)
Calcium: 8.6 mg/dL — ABNORMAL LOW (ref 8.9–10.3)
GFR calc Af Amer: >60 mL/min (ref >60)
GFR calc non Af Amer: >60 mL/min (ref >60)
GLUCOSE: 122 mg/dL — AB (ref 65–99)
Potassium: 4.2 mmol/L (ref 3.5–5.1)
Sodium: 135 mmol/L (ref 135–145)
Total Protein: 6.4 g/dL — ABNORMAL LOW (ref 6.5–8.1)

## 2015-12-23 LAB — PHOSPHORUS: Phosphorus: 2.8 mg/dL (ref 2.5–4.6)

## 2015-12-23 LAB — HEPATITIS C ANTIBODY: HCV Ab: >11 s/co ratio — ABNORMAL HIGH (ref 0.0–0.9)

## 2015-12-23 LAB — HEPATITIS B SURFACE ANTIBODY,QUALITATIVE: Hep B S Ab: REACTIVE

## 2015-12-23 LAB — MAGNESIUM: MAGNESIUM: 1.6 mg/dL — AB (ref 1.7–2.4)

## 2015-12-23 MED ORDER — IPRATROPIUM-ALBUTEROL 0.5-2.5 (3) MG/3ML IN SOLN
3.0000 mL | Freq: Three times a day (TID) | RESPIRATORY_TRACT | Status: DC
  Administered 2015-12-23 – 2015-12-24 (×4): 3 mL via RESPIRATORY_TRACT
  Filled 2015-12-23 (×4): qty 3

## 2015-12-23 MED ORDER — BISACODYL 5 MG PO TBEC: 10.0000 mg | DELAYED_RELEASE_TABLET | Freq: Once | ORAL | Status: DC

## 2015-12-23 MED ORDER — METOCLOPRAMIDE HCL 5 MG/ML IJ SOLN
10.0000 mg | Freq: Once | INTRAMUSCULAR | Status: AC
  Administered 2015-12-23: 10 mg via INTRAVENOUS
  Filled 2015-12-23: qty 2

## 2015-12-23 MED ORDER — PEG-KCL-NACL-NASULF-NA ASC-C 100 G PO SOLR
0.5000 | Freq: Once | ORAL | Status: AC
  Administered 2015-12-24: 100 g via ORAL
  Filled 2015-12-23: qty 1

## 2015-12-23 MED ORDER — BISACODYL 5 MG PO TBEC
10.0000 mg | DELAYED_RELEASE_TABLET | Freq: Once | ORAL | Status: DC
Start: 2015-12-24 — End: 2015-12-23

## 2015-12-23 MED ORDER — METOCLOPRAMIDE HCL 5 MG/ML IJ SOLN
10.0000 mg | Freq: Once | INTRAMUSCULAR | Status: AC
  Administered 2015-12-24: 10 mg via INTRAVENOUS
  Filled 2015-12-23: qty 2

## 2015-12-23 MED ORDER — BISACODYL 5 MG PO TBEC
10.0000 mg | DELAYED_RELEASE_TABLET | Freq: Once | ORAL | Status: AC
  Administered 2015-12-23: 10 mg via ORAL
  Filled 2015-12-23: qty 2

## 2015-12-23 MED ORDER — PEG-KCL-NACL-NASULF-NA ASC-C 100 G PO SOLR
0.5000 | Freq: Once | ORAL | Status: AC
  Administered 2015-12-23: 100 g via ORAL
  Filled 2015-12-23: qty 1

## 2015-12-23 MED ORDER — PEG-KCL-NACL-NASULF-NA ASC-C 100 G PO SOLR: 1.0000 | Freq: Once | ORAL | Status: DC

## 2015-12-23 MED ORDER — BISACODYL 5 MG PO TBEC: 10.0000 mg | DELAYED_RELEASE_TABLET | Freq: Every day | ORAL | Status: DC | PRN

## 2015-12-23 NOTE — Progress Notes (Signed)
PROGRESS NOTE    George Cruz  LZJ:[id number] DOB: June 21, 1955 DOA: 12/17/2015 PCP: No PCP Per Patient   Brief Narrative: The patient is a 60 yo male with chronic tobacco abuse, presented with sepsis syndrome and suspected community acquired pneumonia. On admission complained of diarrhea and worsening dyspnea on exertion for the last 2 weeks. He was found dehydrated with elevated Cr and was suspected to have community acquired pneumonia. Also complained of worsening diarrhea. Confirmed on images from CT scan, abdomen and pelvis. He started having Gross Bloody Diarrhea recently and GI was consulted for Evaluation and the patient is to undergo Colonoscopy. Patient was diagnosed with Hepatitis C and will possibly undergo an EGD as well.   Assessment & Plan:   Principal Problem:   AKI (acute kidney injury) ([place]) Active Problems:   Hyponatremia   Metabolic acidosis   Elevated troponin   Diarrhea   CAP (community acquired pneumonia)   Essential hypertension   COPD (chronic obstructive pulmonary disease) (HCC)   Chest pain  ASSESSMENTS  1. Diarrhea with Hematochezia/BRBPR 2. Community Acquired PNA, poA Improving 3 Community Acquired PNA with likely COPD Exacerbation 4.. AKI from Dehydration, Sepsis and Diarrhea, Improved 5. Hepatitis C 6. Seizure Disorder 7. Longstanding Tobacco Abuse 8. Hx of Heavy EtOH Use  PLAN  1. Diarrhea with Hematochezia/BRBPR  -Hx of Hemorrhoids -Consulted Gi. Appreciate Reccs and Evaluation.  -Continue with Protonix and Anusol. Continue to Monitor H/H.  -Colonoscopy in Am. Bowel Prep per Gastroenterology.  2. Community Acquired Right Lung PNA -No Longer Septic as he is Afebrile, Not Tachycardic, and Not Tachypenic; WBC Elevated likely 2/2 Steroid use -Continue antibiotic therapy with Ceftriaxone IV and Azithromycin po;  -Increased bronchodilator therapy with Duonebs from BID to q6h.  -C/w IV Steroids 40 Methylprednisolone.  -Will continue oxymetry  monitoring and supplemental 02 per [place] to target 02 sat above 92%.  -C/w Robituissin Dm.  -Chest Xray showed improved infiltrates.  -Strep Pnuemo and Legionalla Urine Antigens Negative.  -WBC went from 7.9 -> 11.9 -> 13.3 (liklely 2/2 to Steroids as improvement in breathing). -Continue to Monitor and repeat CBC in Am.  -Acetaminophen for Fevers.  -D/C Fluids    3. Community Acquired PNA with likely COPD Exacerbation -As in #1  4. Hepatitis C -AST 179 and ALT 208; Hepatitis B S Ab was Reactive -Hep C Ab >11.0; GI will send off for Hepatitis C quant and genotype -Possible EGD in AM per Gi. ? Esophageal Varices.  -Repeat CMP in AM  5. AKI from Dehydration, Sepsis and Diarrhea, Resolved.  -Patient still having significant Diarrhea/ BRBPR and Hematochezia.  -D/C'd Fluids -BUN/Cr went from 16/0.92 -> 14/0.87 -> 12/0.80 (today).  -Repeat CMP in Am.   6. Seizure Disorder -Contine Keppra XR 500 mg po Daily   7. Longstanding Tobacco Abuse -Smoking Cessation counseling given.   8. Hx of Significant EtOH Use -Monitor for Signs of Withdrawal  DVT prophylaxis: Lovenox Code Status: FULL Family Communication: No Family present during encounter. Disposition Plan: Home with Home Health vs. SNF at D/C  Consultants:   -[facility]  Procedures: Colonoscopy Antimicrobials: Ceftriaxone and Azithromycin  Subjective:  Patient was seen and examined at bedside today and he stated he was still having gross bloodly bowel movements, but he stated his breathing was better and he felt better since admission.. Denied Cp or Nausea. Was told about Hepatitis C diagnoses and he understood. Wants to get his colonoscopy over with. No other complaints or concerns.   Objective: Vitals:  12/22/15 2006 12/22/15 2104 12/23/15 0734 12/23/15 1454  BP:  122/70 (!) 161/98 (!) 147/81  Pulse:  78 (!) 54 66  Resp:   18 19  Temp:  97.9 F (36.6 C) 98 F (36.7 C) 98 F (36.7 C)  TempSrc:  Oral  Oral  SpO2: 98%  96% 95% 99%  Weight:      Height:        Intake/Output Summary (Last 24 hours) at 12/23/15 1648 Last data filed at 12/22/15 1700  Gross per 24 hour  Intake           181.25 ml  Output                0 ml  Net           181.25 ml   Filed Weights   12/18/15 1330  Weight: 85.1 kg (187 lb 9.8 oz)    Examination: Physical Exam:  Constitutional: WN/WD, NAD and was calm today.  Eyes:  Lids and conjunctivae normal, sclerae anicteric  ENMT: External Ears, Nose appear normal. Grossly normal hearing. Mucous membranes are moist.  Neck: Appears normal, supple, no cervical masses, normal ROM, no appreciable thyromegaly, no JVD.  Respiratory: Diminished breath sounds. No accessory muscle use.Patient not tachypenic on exam. No accessory muscle use.  Cardiovascular: RRR, no murmurs / rubs / gallops. S1 and S2 auscultated. No extremity edema. 2+ pedal pulses.  Abdomen: Soft, non-tender, non-distended. No masses palpated. No appreciable hepatosplenomegaly. Bowel sounds positive x4.  GU: Deferred. Rectal:  Deferred. Stool examined and it was grossly bloody.  Musculoskeletal: No clubbing / cyanosis of digits/nails. No joint deformity upper and lower extremities. Good ROM, no contractures. Normal strength and muscle tone.  Skin: No rashes, lesions, ulcers. No induration; Warm and dry. Diffusely tattooed all over body.  Neurologic: CN 2-12 grossly intact with no focal deficits. Sensation intact in all 4 Extremities, DTR normal. Strength 5/5 in all 4. Romberg sign cerebellar reflexes not assessed.  Psychiatric: Normal judgment and insight. Alert and oriented x 3. Normal mood and appropriate affect.   Data Reviewed: I have reviewed the following.   CBC:  Recent Labs Lab 12/19/15 0256 12/20/15 0621 12/21/15 0622 12/22/15 1047 12/23/15 0530  WBC 8.3 8.3 7.9 11.9* 13.3*  NEUTROABS 5.7 5.4 5.6 9.4* 11.8*  HGB 13.6 13.1 13.9 13.2 12.7*  HCT 39.0 38.1* 40.3 39.3 38.1*  MCV 97.7 98.7 99.8 100.8*  101.1*  PLT 170 215 245 273 528   Basic Metabolic Panel:  Recent Labs Lab 12/19/15 0256 12/20/15 0621 12/21/15 0622 12/22/15 1047 12/23/15 0530  NA 127* 130* 136 137 135  K 3.7 3.3* 3.8 3.7 4.2  CL 97* 101 104 105 105  CO2 20* 21* _0 GLUCOSE 99 112* 106* 114* 122*  BUN 32* 21* _1 CREATININE 1.34* 0.91 0.92 0.87 0.80  CALCIUM 8.4* 8.7* 9.1 9.0 8.6*  MG  --   --   --   --  1.6*  PHOS  --   --   --   --  2.8   GFR: Estimated Creatinine Clearance: 112.4 mL/min (by C-G formula based on SCr of 0.8 mg/dL). Liver Function Tests:  Recent Labs Lab 12/18/15 1451 12/23/15 0530  AST 142* 179*  ALT 91* 208*  ALKPHOS 62 100  BILITOT 0.9 0.5  PROT 7.1 6.4*  ALBUMIN 3.1* 2.8*   No results for input(s): LIPASE, AMYLASE in the last 168 hours. No results for input(s): AMMONIA in  the last 168 hours. Coagulation Profile: No results for input(s): INR, PROTIME in the last 168 hours. Cardiac Enzymes:  Recent Labs Lab 12/18/15 0026 12/18/15 0928 12/18/15 1451 12/18/15 2020  CKTOTAL  --  1,256* 942* 708*  CKMB  --  31.8* 18.7* 11.4*  TROPONINI 0.08* <0.03 0.04*  0.06* <0.03   BNP (last 3 results) No results for input(s): PROBNP in the last 8760 hours. HbA1C: No results for input(s): HGBA1C in the last 72 hours. CBG:  Recent Labs Lab 12/18/15 0047  GLUCAP 128*   Lipid Profile: No results for input(s): CHOL, HDL, LDLCALC, TRIG, CHOLHDL, LDLDIRECT in the last 72 hours. Thyroid Function Tests: No results for input(s): TSH, T4TOTAL, FREET4, T3FREE, THYROIDAB in the last 72 hours. Anemia Panel: No results for input(s): VITAMINB12, FOLATE, FERRITIN, TIBC, IRON, RETICCTPCT in the last 72 hours. Sepsis Labs:  Recent Labs Lab 12/18/15 0056  LATICACIDVEN 0.86    Recent Results (from the past 240 hour(s))  C difficile quick scan w PCR reflex     Status: None   Collection Time: 12/18/15  2:05 AM  Result Value Ref Range Status   C Diff antigen NEGATIVE  NEGATIVE Final   C Diff toxin NEGATIVE NEGATIVE Final   C Diff interpretation No C. difficile detected.  Final  Gastrointestinal Panel by PCR , Stool     Status: None   Collection Time: 12/18/15  2:05 AM  Result Value Ref Range Status   Campylobacter species NOT DETECTED NOT DETECTED Final   Plesimonas shigelloides NOT DETECTED NOT DETECTED Final   Salmonella species NOT DETECTED NOT DETECTED Final   Yersinia enterocolitica NOT DETECTED NOT DETECTED Final   Vibrio species NOT DETECTED NOT DETECTED Final   Vibrio cholerae NOT DETECTED NOT DETECTED Final   Enteroaggregative E coli (EAEC) NOT DETECTED NOT DETECTED Final   Enteropathogenic E coli (EPEC) NOT DETECTED NOT DETECTED Final   Enterotoxigenic E coli (ETEC) NOT DETECTED NOT DETECTED Final   Shiga like toxin producing E coli (STEC) NOT DETECTED NOT DETECTED Final   E. coli O157 NOT DETECTED NOT DETECTED Final   Shigella/Enteroinvasive E coli (EIEC) NOT DETECTED NOT DETECTED Final   Cryptosporidium NOT DETECTED NOT DETECTED Final   Cyclospora cayetanensis NOT DETECTED NOT DETECTED Final   Entamoeba histolytica NOT DETECTED NOT DETECTED Final   Giardia lamblia NOT DETECTED NOT DETECTED Final   Adenovirus F40/41 NOT DETECTED NOT DETECTED Final   Astrovirus NOT DETECTED NOT DETECTED Final   Norovirus GI/GII NOT DETECTED NOT DETECTED Final   Rotavirus A NOT DETECTED NOT DETECTED Final   Sapovirus (I, II, IV, and V) NOT DETECTED NOT DETECTED Final  MRSA PCR Screening     Status: None   Collection Time: 12/18/15  1:25 PM  Result Value Ref Range Status   MRSA by PCR NEGATIVE NEGATIVE Final    Comment:        The GeneXpert MRSA Assay (FDA approved for NASAL specimens only), is one component of a comprehensive MRSA colonization surveillance program. It is not intended to diagnose MRSA infection nor to guide or monitor treatment for MRSA infections.   Culture, blood (Routine X 2) w Reflex to ID Panel     Status: None (Preliminary  result)   Collection Time: 12/19/15  4:15 PM  Result Value Ref Range Status   Specimen Description BLOOD LEFT ANTECUBITAL  Final   Special Requests BOTTLES DRAWN AEROBIC AND ANAEROBIC 6CC  Final   Culture NO GROWTH 4 DAYS  Final  Report Status PENDING  Incomplete  Culture, blood (Routine X 2) w Reflex to ID Panel     Status: None (Preliminary result)   Collection Time: 12/19/15  4:18 PM  Result Value Ref Range Status   Specimen Description BLOOD RIGHT ARM  Final   Special Requests BOTTLES DRAWN AEROBIC AND ANAEROBIC 5CC  Final   Culture NO GROWTH 4 DAYS  Final   Report Status PENDING  Incomplete    Radiology Studies: No results found. Scheduled Meds: . azithromycin  500 mg Oral Q24H  . bisacodyl  10 mg Oral Once  . cefTRIAXone (ROCEPHIN)  IV  1 g Intravenous Q24H  . hydrocortisone  25 mg Rectal BID  . ipratropium-albuterol  3 mL Nebulization TID  . levETIRAcetam  500 mg Oral Daily  . methylPREDNISolone (SOLU-MEDROL) injection  40 mg Intravenous Q12H  . metoCLOPramide (REGLAN) injection  10 mg Intravenous Once   Followed by  . [START ON 12/24/2015] metoCLOPramide (REGLAN) injection  10 mg Intravenous Once  . pantoprazole  40 mg Oral Daily  . peg 3350 powder  0.5 kit Oral Once   And  . [START ON 12/24/2015] peg 3350 powder  0.5 kit Oral Once   Continuous Infusions: NS @ 50 mL/hr Discontinued.     LOS: 5 days   Kerney Elbe, DO [facility] Pager [phone]  If 7PM-7AM, please contact night-coverage [email or web address] Password [email or web address] 12/23/2015, 4:48 PM

## 2015-12-23 NOTE — Progress Notes (Signed)
          Daily Rounding Note  12/23/2015, 8:29 AM  LOS: 5 days   SUBJECTIVE:   Chief complaint:    Cough, persists when he breathes deep.  Still has blood with BMs, mostly blood and stool itself hard to see.   No pain.  Able to get up to walk to bathroom.  Less weak.  Eating well.   OBJECTIVE:         Vital signs in last 24 hours:    Temp:  [97.8 F (36.6 C)-98 F (36.7 C)] 98 F (36.7 C) (10/03 0734) Pulse Rate:  [54-78] 54 (10/03 0734) Resp:  [18-20] 18 (10/03 0734) BP: (122-161)/(70-98) 161/98 (10/03 0734) SpO2:  [95 %-98 %] 95 % (10/03 0734) Last BM Date: 12/22/15 Filed Weights   12/18/15 1330  Weight: 85.1 kg (187 lb 9.8 oz)   General: looks well   Heart: RRR Chest: clear bil but diminished right breath sounds Abdomen: soft, NT, ND.    Extremities: no CCE Neuro/Psych:  Pleasant, cooperative,  Alert and fully oriented.   Intake/Output from previous day: 10/02 0701 - 10/03 0700 In: 181.3 [I.V.:181.3] Out: -   Intake/Output this shift: No intake/output data recorded.  Lab Results:  Recent Labs  12/21/15 0622 12/22/15 1047 12/23/15 0530  WBC 7.9 11.9* 13.3*  HGB 13.9 13.2 12.7*  HCT 40.3 39.3 38.1*  PLT 245 273 281   BMET  Recent Labs  12/21/15 0622 12/22/15 1047 12/23/15 0530  NA 136 137 135  K 3.8 3.7 4.2  CL 104 105 105  CO2 24 24 25   GLUCOSE 106* 114* 122*  BUN 16 14 12   CREATININE 0.92 0.87 0.80  CALCIUM 9.1 9.0 8.6*   LFT  Recent Labs  12/23/15 0530  PROT 6.4*  ALBUMIN 2.8*  AST 179*  ALT 208*  ALKPHOS 100  BILITOT 0.5   PT/INR No results for input(s): LABPROT, INR in the last 72 hours. Hepatitis Panel  Recent Labs  12/22/15 1608  HCVAB >11.0*    Studies/Results: No results found.   Scheduled Meds: . azithromycin  500 mg Oral Q24H  . cefTRIAXone (ROCEPHIN)  IV  1 g Intravenous Q24H  . hydrocortisone  25 mg Rectal BID  . ipratropium-albuterol  3 mL Nebulization  TID  . levETIRAcetam  500 mg Oral Daily  . methylPREDNISolone (SOLU-MEDROL) injection  40 mg Intravenous Q12H  . pantoprazole  40 mg Oral Daily   Continuous Infusions:  PRN Meds:.acetaminophen **OR** acetaminophen, guaiFENesin-dextromethorphan, ipratropium-albuterol, ondansetron **OR** ondansetron (ZOFRAN) IV   ASSESMENT:   *  Rectal bleeding, loose stools.  Continue.    *  CAP, COPD flare.   *  Elevated HCV. Elevated transaminases.  No CT evidence of liver disease   PLAN   *   Send hepatitis C quant and genotype.    *  Colonoscopy (and given dx of Hep C: ? EGD) tomorrow?  Will d/w Dr Lavon PaganiniNandigam.  This is now arranged for 10 AM on 10    Jennye MoccasinSarah Leasha Goldberger  12/23/2015, 8:29 AM Pager: [phone]

## 2015-12-24 ENCOUNTER — Inpatient Hospital Stay (HOSPITAL_COMMUNITY): Payer: Self-pay | Admitting: Certified Registered Nurse Anesthetist

## 2015-12-24 ENCOUNTER — Encounter (HOSPITAL_COMMUNITY)
Admission: EM | Disposition: A | Discharge status: Home Health | Payer: Self-pay | Source: Home / Self Care | Attending: Internal Medicine

## 2015-12-24 ENCOUNTER — Encounter (HOSPITAL_COMMUNITY): Payer: Self-pay

## 2015-12-24 DIAGNOSIS — I10 Essential (primary) hypertension: Secondary | ICD-10-CM

## 2015-12-24 HISTORY — PX: COLONOSCOPY: SHX5424

## 2015-12-24 LAB — COMPREHENSIVE METABOLIC PANEL
ALBUMIN: 2.9 g/dL — AB (ref 3.5–5.0)
ALT: 252 U/L — ABNORMAL HIGH (ref 17–63)
ANION GAP: 7 (ref 5–15)
AST: 203 U/L — AB (ref 15–41)
Alkaline Phosphatase: 69 U/L (ref 38–126)
BUN: 11 mg/dL (ref 6–20)
CHLORIDE: 107 mmol/L (ref 101–111)
CO2: 25 mmol/L (ref 22–32)
Calcium: 9.1 mg/dL (ref 8.9–10.3)
Creatinine, Ser: 0.82 mg/dL (ref 0.61–1.24)
GFR calc Af Amer: >60 mL/min (ref >60)
GLUCOSE: 124 mg/dL — AB (ref 65–99)
POTASSIUM: 4.4 mmol/L (ref 3.5–5.1)
Sodium: 139 mmol/L (ref 135–145)
TOTAL PROTEIN: 6.8 g/dL (ref 6.5–8.1)
Total Bilirubin: 1 mg/dL (ref 0.3–1.2)

## 2015-12-24 LAB — CBC WITH DIFFERENTIAL/PLATELET
BASOS ABS: 0 10*3/uL (ref 0.0–0.1)
Basophils Relative: 0 %
Eosinophils Absolute: 0 10*3/uL (ref 0.0–0.7)
Eosinophils Relative: 0 %
HEMATOCRIT: 39.4 % (ref 39.0–52.0)
HEMOGLOBIN: 13 g/dL (ref 13.0–17.0)
LYMPHS PCT: 6 %
Lymphs Abs: 0.8 10*3/uL (ref 0.7–4.0)
MCH: 33.8 pg (ref 26.0–34.0)
MCHC: 33 g/dL (ref 30.0–36.0)
MCV: 102.3 fL — AB (ref 78.0–100.0)
Monocytes Absolute: 0.5 10*3/uL (ref 0.1–1.0)
Monocytes Relative: 4 %
NEUTROS ABS: 12.3 10*3/uL — AB (ref 1.7–7.7)
Neutrophils Relative %: 90 %
PLATELETS: 301 10*3/uL (ref 150–400)
RBC: 3.85 MIL/uL — AB (ref 4.22–5.81)
RDW: 14.2 % (ref 11.5–15.5)
WBC: 13.6 10*3/uL — AB (ref 4.0–10.5)

## 2015-12-24 LAB — HEPATITIS B SURFACE ANTIGEN: HEP B S AG: NEGATIVE

## 2015-12-24 LAB — CULTURE, BLOOD (ROUTINE X 2)
CULTURE: NO GROWTH
Culture: NO GROWTH

## 2015-12-24 LAB — PHOSPHORUS: Phosphorus: 3.5 mg/dL (ref 2.5–4.6)

## 2015-12-24 LAB — HEPATITIS C ANTIBODY (REFLEX)

## 2015-12-24 LAB — COMMENT2 - HEP PANEL

## 2015-12-24 LAB — MAGNESIUM: MAGNESIUM: 1.7 mg/dL (ref 1.7–2.4)

## 2015-12-24 SURGERY — COLONOSCOPY
Anesthesia: Monitor Anesthesia Care

## 2015-12-24 MED ORDER — SODIUM CHLORIDE 0.9 % IV SOLN: INTRAVENOUS | Status: DC

## 2015-12-24 MED ORDER — LACTATED RINGERS IV SOLN
INTRAVENOUS | Status: DC
  Administered 2015-12-24: 09:00:00 via INTRAVENOUS
  Administered 2015-12-24: 1000 mL via INTRAVENOUS

## 2015-12-24 MED ORDER — PROPOFOL 10 MG/ML IV BOLUS
INTRAVENOUS | Status: DC | PRN
  Administered 2015-12-24: 20 mg via INTRAVENOUS
  Administered 2015-12-24: 30 mg via INTRAVENOUS

## 2015-12-24 MED ORDER — IPRATROPIUM-ALBUTEROL 0.5-2.5 (3) MG/3ML IN SOLN
3.0000 mL | Freq: Two times a day (BID) | RESPIRATORY_TRACT | Status: DC
Start: 2015-12-24 — End: 2015-12-24

## 2015-12-24 MED ORDER — LEVETIRACETAM 500 MG PO TABS: 500.0000 mg | ORAL_TABLET | Freq: Every day | ORAL | 0 refills | Status: AC

## 2015-12-24 MED ORDER — LIDOCAINE 2% (20 MG/ML) 5 ML SYRINGE
INTRAMUSCULAR | Status: DC | PRN
  Administered 2015-12-24: 20 mg via INTRAVENOUS

## 2015-12-24 MED ORDER — PROPOFOL 500 MG/50ML IV EMUL
INTRAVENOUS | Status: DC | PRN
  Administered 2015-12-24: 100 ug/kg/min via INTRAVENOUS

## 2015-12-24 MED ORDER — METHYLPREDNISOLONE SODIUM SUCC 40 MG IJ SOLR
20.0000 mg | Freq: Two times a day (BID) | INTRAMUSCULAR | Status: DC
  Administered 2015-12-24: 20 mg via INTRAVENOUS
  Filled 2015-12-24: qty 1

## 2015-12-24 NOTE — Op Note (Signed)
[facility] Patient Name: George Cruz Procedure Date : 12/24/2015 MRN: [id number] Attending MD: Napoleon Form , MD Date of Birth: 04/04/1955 CSN: [id number] Age: 60 Admit Type: Inpatient Procedure:                Colonoscopy Indications:              Evaluation of unexplained GI bleeding, Clinically                            significant diarrhea of unexplained origin Providers:                Napoleon Form, MD, Michel Bickers, RN,                            [facility], Technician, Deno Etienne. Dareen Piano                            CRNA, CRNA Referring MD:              Medicines:                Monitored Anesthesia Care Complications:            No immediate complications. Estimated Blood Loss:     Estimated blood loss was minimal. Procedure:                Pre-Anesthesia Assessment:                           - Prior to the procedure, a History and Physical                            was performed, and patient medications and                            allergies were reviewed. The patient's tolerance of                            previous anesthesia was also reviewed. The risks                            and benefits of the procedure and the sedation                            options and risks were discussed with the patient.                            All questions were answered, and informed consent                            was obtained. Prior Anticoagulants: The patient has                            taken no previous anticoagulant or antiplatelet  agents. ASA Grade Assessment: III - A patient with                            severe systemic disease. After reviewing the risks                            and benefits, the patient was deemed in                            satisfactory condition to undergo the procedure.                           After obtaining informed consent, the colonoscope                            was  passed under direct vision. Throughout the                            procedure, the patient's blood pressure, pulse, and                            oxygen saturations were monitored continuously. The                            Colonoscope was introduced through the anus and                            advanced to the the terminal ileum, with                            identification of the appendiceal orifice and IC                            valve. The colonoscopy was performed without                            difficulty. The patient tolerated the procedure                            well. The quality of the bowel preparation was                            excellent. The terminal ileum, ileocecal valve,                            appendiceal orifice, and rectum were photographed. Scope In: 9:49:27 AM Scope Out: 10:08:02 AM Scope Withdrawal Time: 0 hours 11 minutes 12 seconds  Total Procedure Duration: 0 hours 18 minutes 35 seconds  Findings:      The perianal and digital rectal examinations were normal.      Normal mucosa was found in the entire colon. Biopsies for histology were       taken with a cold forceps from the right colon and left colon for       evaluation of microscopic  colitis.      A few small-mouthed diverticula were found in the sigmoid colon,       descending colon and transverse colon.      Non-bleeding internal hemorrhoids were found during retroflexion. The       hemorrhoids were large.      The exam was otherwise without abnormality. Impression:               - Normal mucosa in the entire examined colon.                            Biopsied.                           - Diverticulosis in the sigmoid colon, in the                            descending colon and in the transverse colon.                           - Non-bleeding internal hemorrhoids.                           - The examination was otherwise normal. Moderate Sedation:      N/A Recommendation:            - Patient has a contact number available for                            emergencies. The signs and symptoms of potential                            delayed complications were discussed with the                            patient. Return to normal activities tomorrow.                            Written discharge instructions were provided to the                            patient.                           - Resume previous diet.                           - Continue present medications.                           - Await pathology results.                           - Repeat colonoscopy in 10 years for screening                            purposes.                           -  Return to GI clinic at the next available                            appointment for hemorrhoidal band ligation.                           -Anusol suppository at bedtime per rectum as needed Procedure Code(s):        --- Professional ---                           [phone], Colonoscopy, flexible; with biopsy, single                            or multiple Diagnosis Code(s):        --- Professional ---                           K64.8, Other hemorrhoids                           K92.2, Gastrointestinal hemorrhage, unspecified                           R19.7, Diarrhea, unspecified                           K57.30, Diverticulosis of large intestine without                            perforation or abscess without bleeding CPT copyright 2016 [employer]. All rights reserved. The codes documented in this report are preliminary and upon coder review may  be revised to meet current compliance requirements. Napoleon FormKavitha V. Britta Louth, MD 12/24/2015 10:15:05 AM This report has been signed electronically. Number of Addenda: 0

## 2015-12-24 NOTE — Progress Notes (Addendum)
Pt was bradycardic this AM. Reassessed and still bradycardic. MD notified. No new orders were put in. Pt is asymptomatic. Will continue to monitor.  Order for EKG was put in. Will continue to monitor.

## 2015-12-24 NOTE — H&P (View-Only) (Signed)
          Daily Rounding Note  12/23/2015, 8:29 AM  LOS: 5 days   SUBJECTIVE:   Chief complaint:    Cough, persists when he breathes deep.  Still has blood with BMs, mostly blood and stool itself hard to see.   No pain.  Able to get up to walk to bathroom.  Less weak.  Eating well.   OBJECTIVE:         Vital signs in last 24 hours:    Temp:  [97.8 F (36.6 C)-98 F (36.7 C)] 98 F (36.7 C) (10/03 0734) Pulse Rate:  [54-78] 54 (10/03 0734) Resp:  [18-20] 18 (10/03 0734) BP: (122-161)/(70-98) 161/98 (10/03 0734) SpO2:  [95 %-98 %] 95 % (10/03 0734) Last BM Date: 12/22/15 Filed Weights   12/18/15 1330  Weight: 85.1 kg (187 lb 9.8 oz)   General: looks well   Heart: RRR Chest: clear bil but diminished right breath sounds Abdomen: soft, NT, ND.    Extremities: no CCE Neuro/Psych:  Pleasant, cooperative,  Alert and fully oriented.   Intake/Output from previous day: 10/02 0701 - 10/03 0700 In: 181.3 [I.V.:181.3] Out: -   Intake/Output this shift: No intake/output data recorded.  Lab Results:  Recent Labs  12/21/15 0622 12/22/15 1047 12/23/15 0530  WBC 7.9 11.9* 13.3*  HGB 13.9 13.2 12.7*  HCT 40.3 39.3 38.1*  PLT 245 273 281   BMET  Recent Labs  12/21/15 0622 12/22/15 1047 12/23/15 0530  NA 136 137 135  K 3.8 3.7 4.2  CL 104 105 105  CO2 24 24 25   GLUCOSE 106* 114* 122*  BUN 16 14 12   CREATININE 0.92 0.87 0.80  CALCIUM 9.1 9.0 8.6*   LFT  Recent Labs  12/23/15 0530  PROT 6.4*  ALBUMIN 2.8*  AST 179*  ALT 208*  ALKPHOS 100  BILITOT 0.5   PT/INR No results for input(s): LABPROT, INR in the last 72 hours. Hepatitis Panel  Recent Labs  12/22/15 1608  HCVAB >11.0*    Studies/Results: No results found.   Scheduled Meds: . azithromycin  500 mg Oral Q24H  . cefTRIAXone (ROCEPHIN)  IV  1 g Intravenous Q24H  . hydrocortisone  25 mg Rectal BID  . ipratropium-albuterol  3 mL Nebulization  TID  . levETIRAcetam  500 mg Oral Daily  . methylPREDNISolone (SOLU-MEDROL) injection  40 mg Intravenous Q12H  . pantoprazole  40 mg Oral Daily   Continuous Infusions:  PRN Meds:.acetaminophen **OR** acetaminophen, guaiFENesin-dextromethorphan, ipratropium-albuterol, ondansetron **OR** ondansetron (ZOFRAN) IV   ASSESMENT:   *  Rectal bleeding, loose stools.  Continue.    *  CAP, COPD flare.   *  Elevated HCV. Elevated transaminases.  No CT evidence of liver disease   PLAN   *   Send hepatitis C quant and genotype.    *  Colonoscopy (and given dx of Hep C: ? EGD) tomorrow?  Will d/w Dr Lavon PaganiniNandigam.  This is now arranged for 10 AM on 10    Jennye MoccasinSarah Malena Timpone  12/23/2015, 8:29 AM Pager: [phone]

## 2015-12-24 NOTE — Anesthesia Postprocedure Evaluation (Signed)
Anesthesia Post Note  Patient: George GulaRalph Cruz  Procedure(s) Performed: Procedure(s) (LRB): COLONOSCOPY (N/A)  Patient location during evaluation: Endoscopy Anesthesia Type: MAC Level of consciousness: awake and alert Pain management: pain level controlled Vital Signs Assessment: post-procedure vital signs reviewed and stable Respiratory status: spontaneous breathing, nonlabored ventilation, respiratory function stable and patient connected to nasal cannula oxygen Cardiovascular status: stable and blood pressure returned to baseline Anesthetic complications: no    Last Vitals:  Vitals:   12/24/15 1030 12/24/15 1330  BP: (!) 170/98 136/69  Pulse: (!) 47 64  Resp: 15 16  Temp:  36.7 C    Last Pain:  Vitals:   12/24/15 1330  TempSrc: Oral  PainSc:                  Shelton SilvasKevin D Hollis

## 2015-12-24 NOTE — Interval H&P Note (Signed)
History and Physical Interval Note:  12/24/2015 9:33 AM  George Cruz  has presented today for surgery, with the diagnosis of passing blood per rectum.  The various methods of treatment have been discussed with the patient and family. After consideration of risks, benefits and other options for treatment, the patient has consented to  Procedure(s): COLONOSCOPY (N/A) as a surgical intervention .  The patient's history has been reviewed, patient examined, no change in status, stable for surgery.  I have reviewed the patient's chart and labs.  Questions were answered to the patient's satisfaction.     Tequia Wolman

## 2015-12-24 NOTE — Transfer of Care (Signed)
Immediate Anesthesia Transfer of Care Note  Patient: George Cruz  Procedure(s) Performed: Procedure(s): COLONOSCOPY (N/A)  Patient Location: Endoscopy Unit  Anesthesia Type:MAC  Level of Consciousness: awake, alert , oriented and patient cooperative  Airway & Oxygen Therapy: Patient Spontanous Breathing and Patient connected to face mask oxygen  Post-op Assessment: Report given to RN and Post -op Vital signs reviewed and stable  Post vital signs: Reviewed and stable  Last Vitals:  Vitals:   12/24/15 0917 12/24/15 1013  BP: (!) 144/86 115/64  Pulse: 61 (!) 58  Resp: 13 20  Temp: 36.5 C 36.5 C    Last Pain:  Vitals:   12/24/15 1013  TempSrc: Oral  PainSc:       Patients Stated Pain Goal: 2 (12/20/15 1937)  Complications: No apparent anesthesia complications

## 2015-12-24 NOTE — Progress Notes (Signed)
Physical Therapy Treatment Patient Details Name: George GulaRalph Brisky MRN: [id number] DOB: 08/01/1955 Today's Date: 12/24/2015    History of Present Illness Pt is a 60 y/o male admitted secondary to AKI, hyponatremia and diarrhea. PMH including but not limited to DM, HTN, COPD, seizures and colon cancer.    PT Comments    Patient is progressing toward mobility goals. Current plan remains appropriate.   Follow Up Recommendations  Home health PT;Supervision for mobility/OOB     Equipment Recommendations  Cane    Recommendations for Other Services       Precautions / Restrictions Precautions Precautions: Fall Restrictions Weight Bearing Restrictions: No    Mobility  Bed Mobility Overal bed mobility: Needs Assistance Bed Mobility: Sit to Supine       Sit to supine: Supervision   General bed mobility comments: supervision for safety and increased time; cues for positioning in bed; pt easily irritated and asked "what are you doing? why are you doing that?" when therapist flattended bed and repositioned pillows; when explained that it would be easier to position in bed this way pt appeared confused   Transfers Overall transfer level: Needs assistance Equipment used: None Transfers: Sit to/from Stand Sit to Stand: Min guard         General transfer comment: min guard for safety; cues for safety and taking time to steady self upon standing  Ambulation/Gait Ambulation/Gait assistance: Min guard Ambulation Distance (Feet): 80 Feet Assistive device:  (rail in hallway ~75% of time) Gait Pattern/deviations: Step-through pattern;Decreased stride length Gait velocity: decreased   General Gait Details: slow gait speed and guarded movements; cues to increase gait speed but pt unable make significant change; pt with gait deviations when performing horizontal/vertical head turns and reported dizziness with looking up and required standing rest break but with no LOB; pt c/o SOB and with  labored breathing; vital signs WNL   Stairs            Wheelchair Mobility    Modified Rankin (Stroke Patients Only)       Balance     Sitting balance-Leahy Scale: Good       Standing balance-Leahy Scale: Good                      Cognition Arousal/Alertness: Awake/alert Behavior During Therapy: WFL for tasks assessed/performed Overall Cognitive Status: Within Functional Limits for tasks assessed                      Exercises      General Comments        Pertinent Vitals/Pain Pain Assessment: No/denies pain    Home Living                      Prior Function            PT Goals (current goals can now be found in the care plan section) Acute Rehab PT Goals Patient Stated Goal: get better Progress towards PT goals: Progressing toward goals    Frequency    Min 3X/week      PT Plan Current plan remains appropriate    Co-evaluation             End of Session Equipment Utilized During Treatment: Gait belt Activity Tolerance: Patient limited by fatigue Patient left: in bed;with call bell/phone within reach     Time: 1449-1515 PT Time Calculation (min) (ACUTE ONLY): 26 min  Charges:  $Gait Training: 8-22  mins $Therapeutic Activity: 8-22 mins                    G Codes:      Derek Mound, PTA Pager: [phone]   12/24/2015, 4:13 PM

## 2015-12-24 NOTE — Discharge Summary (Signed)
Discharge Summary  Orin Eberwein ZOX:[id number] DOB: 05/02/1955  PCP: No PCP Per Patient  Admit date: 12/17/2015 Discharge date: 12/24/2015  Time spent: 25 minutes   Recommendations for Outpatient Follow-up:  1. Patient will follow-up with gastroenterology on biopsies 2. Patient recommended not to return back to work for several days 3. He is advised to restart his Norvasc and Keppra   Discharge Diagnoses:  Active Hospital Problems   Diagnosis Date Noted  . AKI (acute kidney injury) (HCC) 12/18/2015  . Blood per rectum   . Hyponatremia 12/18/2015  . Metabolic acidosis 12/18/2015  . Elevated troponin 12/18/2015  . Diarrhea 12/18/2015  . CAP (community acquired pneumonia) 12/18/2015  . Essential hypertension 12/18/2015  . COPD (chronic obstructive pulmonary disease) (HCC) 12/18/2015  . Chest pain 12/18/2015    Resolved Hospital Problems   Diagnosis Date Noted Date Resolved  No resolved problems to display.    Discharge Condition: Improved, being discharged home   Diet recommendation: Heart healthy   Vitals:   12/24/15 1330 12/24/15 1549  BP: 136/69 (!) 142/88  Pulse: 64 66  Resp: 16 20  Temp: 98.1 F (36.7 C) 97.7 F (36.5 C)    History of present illness:   The patient is a 60 yo male with chronic tobacco abuse, presented with  suspected community acquired pneumonia. On admission complained of diarrhea and worsening dyspnea on exertion for the last 2 weeks. He was found dehydrated with elevated Cr and was suspected to have community acquired pneumonia. Also complained of worsening diarrhea. Confirmed on images from CT scan, abdomen and pelvis. He started having Gross Bloody Diarrhea recently and GI was consulted for Evaluation and the patient is to undergo Colonoscopy.  Hospital Course:  Principal Problem:   AKI (acute kidney injury) (HCC): Secondary to pneumonia, and dehydration. Resolved with IV fluids Active Problems:   Hyponatremia   Metabolic acidosis  Elevated troponin   Diarrhea   CAP (community acquired pneumonia) with likely COPD exacerbation: Received steroids plus duo nebs and antibiotics. Breathing now stable area on room air. Patient completed full course of antibiotics. Felt to be stable for discharge   Essential hypertension: Patient been noncompliant with his Norvasc. Advised to restart   COPD (chronic obstructive pulmonary disease) (HCC): Stable during this hospitalization, advised to quit smoking    Blood per rectum/diarrhea: Colonoscopy done 10/4 unrevealing. Patient denies large nonbleeding hemorrhoids and a few diverticula. Biopsies taken. On Protonix and Anusol. Stable. Patient will follow-up with GI as outpatient  Hepatitis C: Mildly elevated transaminases. Surface antibody reactive for hepatitis B. GI has sent off quantitative and genotype and will follow up with patient as outpatient  Procedures:  Colonoscopy done 10/4: As above. Unrevealing. Few nonbleeding hemorrhoids and diverticula noted  Consultations:  GI  Discharge Exam: BP (!) 142/88 (BP Location: Left Arm)   Pulse 66   Temp 97.7 F (36.5 C) (Oral)   Resp 20   Ht 6\' 1"  (1.854 m)   Wt 85.1 kg (187 lb 9.8 oz)   SpO2 96%   BMI 24.75 kg/m   General: Alert and oriented 3, no acute distress Cardiovascular: regular rate and rhythm, S1-S2  Respiratory: clear to auscultation bilaterally   Discharge Instructions You were cared for by a hospitalist during your hospital stay. If you have any questions about your discharge medications or the care you received while you were in the hospital after you are discharged, you can call the unit and asked to speak with the hospitalist on call if the  hospitalist that took care of you is not available. Once you are discharged, your primary care physician will handle any further medical issues. Please note that NO REFILLS for any discharge medications will be authorized once you are discharged, as it is imperative that you  return to your primary care physician (or establish a relationship with a primary care physician if you do not have one) for your aftercare needs so that they can reassess your need for medications and monitor your lab values.  Discharge Instructions    Diet - low sodium heart healthy    Complete by:  As directed    Increase activity slowly    Complete by:  As directed        Medication List    TAKE these medications   amLODipine 10 MG tablet Commonly known as:  NORVASC Take 1 tablet (10 mg total) by mouth daily.   levETIRAcetam 500 MG tablet Commonly known as:  KEPPRA Take 1 tablet (500 mg total) by mouth daily.      No Known Allergies Follow-up Information    [facility] Follow up in 1 week(s).   Specialty:  Gastroenterology Contact information: [address] [place] [address] [phone]       [facility]. Call today.   Why:  to schedule your follow up appointment Contact information: [address] [facility] [address] [phone]           The results of significant diagnostics from this hospitalization (including imaging, microbiology, ancillary and laboratory) are listed below for reference.    Significant Diagnostic Studies: Ct Abdomen Pelvis Wo Contrast  Result Date: 12/18/2015 CLINICAL DATA:  Acute onset of nausea, vomiting and diarrhea. Generalized abdominal pain and gross hematuria. Initial encounter. EXAM: CT ABDOMEN AND PELVIS WITHOUT CONTRAST TECHNIQUE: Multidetector CT imaging of the abdomen and pelvis was performed following the standard protocol without IV contrast. COMPARISON:  None. FINDINGS: Lower chest: Right basilar airspace opacity raises concern for pneumonia. The visualized portions of the mediastinum are unremarkable. Hepatobiliary: The liver is unremarkable in appearance. The gallbladder is unremarkable in appearance. The common bile duct remains  normal in caliber. Pancreas: The pancreas is within normal limits. Spleen: The spleen is unremarkable in appearance. Adrenals/Urinary Tract: The adrenal glands are unremarkable in appearance. A tiny nonobstructing 3 mm stone is noted near the upper pole of the left kidney, and a 3 mm stone is noted at the lower pole of the right kidney. The kidneys are otherwise unremarkable. There is no evidence of hydronephrosis. No obstructing ureteral stones are identified. Mild nonspecific perinephric stranding is noted bilaterally. Stomach/Bowel: The stomach is unremarkable in appearance. The small bowel is within normal limits. The appendix is normal in caliber, without evidence of appendicitis. Mild diverticulosis is noted at the proximal sigmoid colon, without evidence of diverticulitis. Vascular/Lymphatic: Minimal calcification is noted along the abdominal aorta. Reproductive: Anterior wall thickening along the bladder is nonspecific. Would correlate for any evidence of cystitis, and consider cystoscopy to exclude underlying mass. The prostate remains normal in size, with minimal calcification. Other: No additional soft tissue abnormalities are seen. Musculoskeletal: No acute osseous abnormalities are identified. There is minimal grade 1 retrolisthesis of L1 on L2, of L2 on L3 and of L3 on L4, and grade 2 anterolisthesis of L5 on S1. Chronic bilateral pars defects are noted at L5. The visualized musculature is unremarkable in appearance. IMPRESSION: 1. Right basilar airspace opacity is concerning for pneumonia. 2.  Anterior bladder wall thickening is nonspecific. Would correlate for any evidence of cystitis, and consider cystoscopy to exclude underlying mass. 3. Nonobstructing small bilateral renal stones seen, measuring up to 3 mm in size. No evidence of hydronephrosis. 4. Mild diverticulosis at the proximal sigmoid colon, without evidence of diverticulitis. 5. Chronic bilateral pars defects at L5, with grade 2  anterolisthesis of L5 on S1. Electronically Signed   By: Roanna Raider M.D.   On: 12/18/2015 03:25   Dg Chest 1 View  Result Date: 12/18/2015 CLINICAL DATA:  Cough, dyspnea EXAM: CHEST 1 VIEW COMPARISON:  12/18/2015 FINDINGS: Cardiomediastinal silhouette is stable. No acute infiltrate or pleural effusion. No pulmonary edema. Mild elevation of the left hemidiaphragm again noted. IMPRESSION: No active disease. Electronically Signed   By: Natasha Mead M.D.   On: 12/18/2015 13:42   Dg Chest 2 View  Result Date: 12/21/2015 CLINICAL DATA:  Pt reports mid-sternal chest pain and dizziness x 2 days; he reports chronic cough and h/o PNA and HTN; smoker EXAM: CHEST  2 VIEW COMPARISON:  12/18/2015 FINDINGS: Cardiac silhouette is normal in size and configuration. The aorta is tortuous. No mediastinal or hilar masses or evidence of adenopathy. Clear lungs.  No pleural effusion or pneumothorax. Bony thorax is demineralized but intact. IMPRESSION: No active cardiopulmonary disease Electronically Signed   By: Amie Portland M.D.   On: 12/21/2015 07:55   Dg Chest 2 View  Result Date: 12/18/2015 CLINICAL DATA:  60 year old male with shortness of breath. EXAM: CHEST  2 VIEW COMPARISON:  Chest radiograph dated 07/03/2015 FINDINGS: The heart size and mediastinal contours are within normal limits. Both lungs are clear. The visualized skeletal structures are unremarkable. IMPRESSION: No active cardiopulmonary disease. Electronically Signed   By: Elgie Collard M.D.   On: 12/18/2015 02:19    Microbiology: Recent Results (from the past 240 hour(s))  C difficile quick scan w PCR reflex     Status: None   Collection Time: 12/18/15  2:05 AM  Result Value Ref Range Status   C Diff antigen NEGATIVE NEGATIVE Final   C Diff toxin NEGATIVE NEGATIVE Final   C Diff interpretation No C. difficile detected.  Final  Gastrointestinal Panel by PCR , Stool     Status: None   Collection Time: 12/18/15  2:05 AM  Result Value Ref Range  Status   Campylobacter species NOT DETECTED NOT DETECTED Final   Plesimonas shigelloides NOT DETECTED NOT DETECTED Final   Salmonella species NOT DETECTED NOT DETECTED Final   Yersinia enterocolitica NOT DETECTED NOT DETECTED Final   Vibrio species NOT DETECTED NOT DETECTED Final   Vibrio cholerae NOT DETECTED NOT DETECTED Final   Enteroaggregative E coli (EAEC) NOT DETECTED NOT DETECTED Final   Enteropathogenic E coli (EPEC) NOT DETECTED NOT DETECTED Final   Enterotoxigenic E coli (ETEC) NOT DETECTED NOT DETECTED Final   Shiga like toxin producing E coli (STEC) NOT DETECTED NOT DETECTED Final   E. coli O157 NOT DETECTED NOT DETECTED Final   Shigella/Enteroinvasive E coli (EIEC) NOT DETECTED NOT DETECTED Final   Cryptosporidium NOT DETECTED NOT DETECTED Final   Cyclospora cayetanensis NOT DETECTED NOT DETECTED Final   Entamoeba histolytica NOT DETECTED NOT DETECTED Final   Giardia lamblia NOT DETECTED NOT DETECTED Final   Adenovirus F40/41 NOT DETECTED NOT DETECTED Final   Astrovirus NOT DETECTED NOT DETECTED Final   Norovirus GI/GII NOT DETECTED NOT DETECTED Final   Rotavirus A NOT DETECTED NOT DETECTED Final   Sapovirus (I, II, IV,  and V) NOT DETECTED NOT DETECTED Final  MRSA PCR Screening     Status: None   Collection Time: 12/18/15  1:25 PM  Result Value Ref Range Status   MRSA by PCR NEGATIVE NEGATIVE Final    Comment:        The GeneXpert MRSA Assay (FDA approved for NASAL specimens only), is one component of a comprehensive MRSA colonization surveillance program. It is not intended to diagnose MRSA infection nor to guide or monitor treatment for MRSA infections.   Culture, blood (Routine X 2) w Reflex to ID Panel     Status: None   Collection Time: 12/19/15  4:15 PM  Result Value Ref Range Status   Specimen Description BLOOD LEFT ANTECUBITAL  Final   Special Requests BOTTLES DRAWN AEROBIC AND ANAEROBIC 6CC  Final   Culture NO GROWTH 5 DAYS  Final   Report Status  12/24/2015 FINAL  Final  Culture, blood (Routine X 2) w Reflex to ID Panel     Status: None   Collection Time: 12/19/15  4:18 PM  Result Value Ref Range Status   Specimen Description BLOOD RIGHT ARM  Final   Special Requests BOTTLES DRAWN AEROBIC AND ANAEROBIC 5CC  Final   Culture NO GROWTH 5 DAYS  Final   Report Status 12/24/2015 FINAL  Final     Labs: Basic Metabolic Panel:  Recent Labs Lab 12/20/15 0621 12/21/15 0622 12/22/15 1047 12/23/15 0530 12/24/15 0504  NA 130* 136 137 135 139  K 3.3* 3.8 3.7 4.2 4.4  CL 101 104 105 105 107  CO2 21* 24 24 25 25   GLUCOSE 112* 106* 114* 122* 124*  BUN 21* 16 14 12 11   CREATININE 0.91 0.92 0.87 0.80 0.82  CALCIUM 8.7* 9.1 9.0 8.6* 9.1  MG  --   --   --  1.6* 1.7  PHOS  --   --   --  2.8 3.5   Liver Function Tests:  Recent Labs Lab 12/18/15 1451 12/23/15 0530 12/24/15 0504  AST 142* 179* 203*  ALT 91* 208* 252*  ALKPHOS 62 100 69  BILITOT 0.9 0.5 1.0  PROT 7.1 6.4* 6.8  ALBUMIN 3.1* 2.8* 2.9*   No results for input(s): LIPASE, AMYLASE in the last 168 hours. No results for input(s): AMMONIA in the last 168 hours. CBC:  Recent Labs Lab 12/20/15 0621 12/21/15 0622 12/22/15 1047 12/23/15 0530 12/24/15 0504  WBC 8.3 7.9 11.9* 13.3* 13.6*  NEUTROABS 5.4 5.6 9.4* 11.8* 12.3*  HGB 13.1 13.9 13.2 12.7* 13.0  HCT 38.1* 40.3 39.3 38.1* 39.4  MCV 98.7 99.8 100.8* 101.1* 102.3*  PLT 215 245 273 281 301   Cardiac Enzymes:  Recent Labs Lab 12/18/15 0026 12/18/15 0928 12/18/15 1451 12/18/15 2020  CKTOTAL  --  1,256* 942* 708*  CKMB  --  31.8* 18.7* 11.4*  TROPONINI 0.08* <0.03 0.04*  0.06* <0.03   BNP: BNP (last 3 results)  Recent Labs  12/18/15 0026  BNP 24.3    ProBNP (last 3 results) No results for input(s): PROBNP in the last 8760 hours.  CBG:  Recent Labs Lab 12/18/15 0047  GLUCAP 128*       Signed:  Hollice EspyKRISHNAN,Terrace Chiem K, MD [facility] 12/24/2015, 7:38 PM

## 2015-12-24 NOTE — Progress Notes (Signed)
Discharge Note:  Patient alert and oriented X4 and in no distress.  Patient given discharge instructions regarding signs and symptoms to report, medication, diet, activity, and upcoming appointments.  He verbalized understanding of all instructions.  Peripheral IV discontinued.  Patient confirmed that he has all of his personal belongings.  He will be transported out via wheelchair by hospital staff once his ride arrives.

## 2015-12-24 NOTE — Progress Notes (Signed)
Pt given discharge instructions, prescriptions, and care notes. Pt verbalized understanding AEB no further questions or concerns at this time. IV was discontinued, no redness, pain, or swelling noted at this time. Pt left the floor via wheelchair with staff in stable condition. 

## 2015-12-24 NOTE — Anesthesia Procedure Notes (Addendum)
Procedure Name: MAC Date/Time: 12/24/2015 9:38 AM Performed by: Annabelle HarmanSMITH, Mick Tanguma A Pre-anesthesia Checklist: Patient identified, Emergency Drugs available, Suction available and Patient being monitored Patient Re-evaluated:Patient Re-evaluated prior to inductionOxygen Delivery Method: Simple face mask Intubation Type: IV induction

## 2015-12-24 NOTE — Anesthesia Preprocedure Evaluation (Addendum)
Anesthesia Evaluation  Patient identified by MRN, date of birth, ID band Patient awake    Reviewed: Allergy & Precautions, NPO status , Patient's Chart, lab work & pertinent test results  Airway Mallampati: II  TM Distance: >3 FB Neck ROM: Full    Dental  (+) Teeth Intact, Poor Dentition, Dental Advisory Given   Pulmonary COPD, Current Smoker,    breath sounds clear to auscultation       Cardiovascular hypertension, Pt. on medications  Rhythm:Regular Rate:Normal     Neuro/Psych Seizures -,  negative psych ROS   GI/Hepatic negative GI ROS, Neg liver ROS,   Endo/Other  diabetes  Renal/GU Renal InsufficiencyRenal disease  negative genitourinary   Musculoskeletal negative musculoskeletal ROS (+)   Abdominal   Peds negative pediatric ROS (+)  Hematology negative hematology ROS (+)   Anesthesia Other Findings   Reproductive/Obstetrics negative OB ROS                            12/2015 EKG: sinus bradycardia.  Anesthesia Physical Anesthesia Plan  ASA: III  Anesthesia Plan: MAC   Post-op Pain Management:    Induction: Intravenous  Airway Management Planned: Natural Airway  Additional Equipment:   Intra-op Plan:   Post-operative Plan:   Informed Consent: I have reviewed the patients History and Physical, chart, labs and discussed the procedure including the risks, benefits and alternatives for the proposed anesthesia with the patient or authorized representative who has indicated his/her understanding and acceptance.   Dental advisory given  Plan Discussed with: CRNA  Anesthesia Plan Comments:         Anesthesia Quick Evaluation

## 2015-12-25 ENCOUNTER — Encounter (HOSPITAL_COMMUNITY): Payer: Self-pay | Admitting: Gastroenterology

## 2015-12-25 LAB — HCV RNA QUANT
HCV Quantitative Log: 6.693 log10 IU/mL (ref >1.70)
HCV Quantitative: 4930000 IU/mL (ref >50)

## 2015-12-27 ENCOUNTER — Other Ambulatory Visit: Payer: Self-pay

## 2015-12-27 ENCOUNTER — Emergency Department (HOSPITAL_COMMUNITY)
Admission: EM | Admit: 2015-12-27 | Discharge: 2015-12-28 | Disposition: A | Discharge status: Home / Self Care | Payer: Medicaid - Out of State | Attending: Emergency Medicine | Admitting: Emergency Medicine

## 2015-12-27 ENCOUNTER — Encounter (HOSPITAL_COMMUNITY): Payer: Self-pay | Admitting: *Deleted

## 2015-12-27 ENCOUNTER — Emergency Department (HOSPITAL_COMMUNITY): Payer: Medicaid - Out of State

## 2015-12-27 DIAGNOSIS — B354 Tinea corporis: Secondary | ICD-10-CM | POA: Insufficient documentation

## 2015-12-27 DIAGNOSIS — I10 Essential (primary) hypertension: Secondary | ICD-10-CM | POA: Insufficient documentation

## 2015-12-27 DIAGNOSIS — E119 Type 2 diabetes mellitus without complications: Secondary | ICD-10-CM | POA: Insufficient documentation

## 2015-12-27 DIAGNOSIS — J449 Chronic obstructive pulmonary disease, unspecified: Secondary | ICD-10-CM | POA: Insufficient documentation

## 2015-12-27 DIAGNOSIS — F17210 Nicotine dependence, cigarettes, uncomplicated: Secondary | ICD-10-CM | POA: Insufficient documentation

## 2015-12-27 LAB — CBC
HCT: 40.5 % (ref 39.0–52.0)
Hemoglobin: 13.5 g/dL (ref 13.0–17.0)
MCH: 34 pg (ref 26.0–34.0)
MCHC: 33.3 g/dL (ref 30.0–36.0)
MCV: 102 fL — ABNORMAL HIGH (ref 78.0–100.0)
PLATELETS: 270 10*3/uL (ref 150–400)
RBC: 3.97 MIL/uL — ABNORMAL LOW (ref 4.22–5.81)
RDW: 13.9 % (ref 11.5–15.5)
WBC: 6.5 10*3/uL (ref 4.0–10.5)

## 2015-12-27 LAB — BASIC METABOLIC PANEL
Anion gap: 10 (ref 5–15)
BUN: 15 mg/dL (ref 6–20)
CALCIUM: 9.7 mg/dL (ref 8.9–10.3)
CO2: 26 mmol/L (ref 22–32)
CREATININE: 0.82 mg/dL (ref 0.61–1.24)
Chloride: 102 mmol/L (ref 101–111)
GFR calc Af Amer: >60 mL/min (ref >60)
GLUCOSE: 139 mg/dL — AB (ref 65–99)
Potassium: 4 mmol/L (ref 3.5–5.1)
Sodium: 138 mmol/L (ref 135–145)

## 2015-12-27 LAB — HEPATITIS C GENOTYPE: HCV Genotype: 3

## 2015-12-27 LAB — TROPONIN I: Troponin I: <0.03 ng/mL (ref <0.03)

## 2015-12-27 MED ORDER — FLUCONAZOLE 100 MG PO TABS
200.0000 mg | ORAL_TABLET | Freq: Once | ORAL | Status: AC
  Administered 2015-12-28: 200 mg via ORAL
  Filled 2015-12-27: qty 2

## 2015-12-27 MED ORDER — FLUCONAZOLE 100 MG PO TABS: 100.0000 mg | ORAL_TABLET | Freq: Every day | ORAL | 0 refills | Status: AC

## 2015-12-27 NOTE — ED Triage Notes (Signed)
The pt is here for a red raised rash from his lower abd down  With dizziness and sob since he left the hospital Thursday after being treated for pneumonia.  He is currently hyperventilating. He denies being anxious

## 2015-12-27 NOTE — ED Provider Notes (Signed)
[facility]-EMERGENCY DEPT Provider Note   CSN: [id number] Arrival date & time: 12/27/15  1801     History   Chief Complaint Chief Complaint  Patient presents with  . Rash    HPI George GulaRalph Ascher is a 60 y.o. male.  HPI Patient has PMH of anemia, COPD, diabetes, hypertension, seizures.   He was discharged from the hospital this past Thursday after receiving IV steroids and abx for pneumonia. At discharged he was restarted on his BP medications and Keppra. He reports since leaving he developed rash to his neck, chest and in between his thighs. He says it is extremely itching and bothersome. He has not tried any medications for this. He has not had any fevers, chills, weight loss, CP, SOB, facial or oral swelling. Denies having had this rash in the past.  Past Medical History:  Diagnosis Date  . Anemia   . COPD (chronic obstructive pulmonary disease) (HCC)   . Diabetes mellitus without complication (HCC)   . Hypertension   . Seizures [facility](HCC)     Patient Active Problem List   Diagnosis Date Noted  . Blood per rectum   . AKI (acute kidney injury) (HCC) 12/18/2015  . Hyponatremia 12/18/2015  . Metabolic acidosis 12/18/2015  . Elevated troponin 12/18/2015  . Diarrhea 12/18/2015  . CAP (community acquired pneumonia) 12/18/2015  . Essential hypertension 12/18/2015  . COPD (chronic obstructive pulmonary disease) (HCC) 12/18/2015  . Chest pain 12/18/2015    Past Surgical History:  Procedure Laterality Date  . COLONOSCOPY N/A 12/24/2015   Procedure: COLONOSCOPY;  Surgeon: Napoleon FormKavitha V Nandigam, MD;  Location: [facility] ENDOSCOPY;  Service: Endoscopy;  Laterality: N/A;  . EXCISIONAL HEMORRHOIDECTOMY    . head surgery         Home Medications    Prior to Admission medications   Medication Sig Start Date End Date Taking? Authorizing Provider  levETIRAcetam (KEPPRA) 500 MG tablet Take 1 tablet (500 mg total) by mouth daily. 12/24/15 01/23/16 Yes Hollice EspySendil K Krishnan, MD  amLODipine (NORVASC) 10 MG  tablet Take 1 tablet (10 mg total) by mouth daily. Patient not taking: Reported on 12/27/2015 07/03/15   Tomasita CrumbleAdeleke Oni, MD  fluconazole (DIFLUCAN) 100 MG tablet Take 1 tablet (100 mg total) by mouth daily. 12/27/15   Marlon Peliffany Liberty Seto, PA-C    Family History Family History  Problem Relation Age of Onset  . Stroke Mother   . Cancer Father     Social History Social History  Substance Use Topics  . Smoking status: Current Some Day Smoker    Packs/day: 1.00    Types: Cigarettes  . Smokeless tobacco: Never Used  . Alcohol use Yes     Comment: 5 days/week     Allergies   Review of patient's allergies indicates no known allergies.   Review of Systems Review of Systems  Review of Systems All other systems negative except as documented in the HPI. All pertinent positives and negatives as reviewed in the HPI.  Physical Exam Updated Vital Signs BP 129/71 (BP Location: Left Arm)   Pulse 61   Temp 97.9 F (36.6 C) (Oral)   Resp 22   SpO2 99%   Physical Exam  Constitutional: He appears well-developed and well-nourished. No distress.  HENT:  Head: Normocephalic and atraumatic.  Eyes: Pupils are equal, round, and reactive to light.  Neck: Normal range of motion. Neck supple.  Cardiovascular: Normal rate and regular rhythm.   Pulmonary/Chest: Effort normal.  Abdominal: Soft.  Neurological: He is alert.  Skin:  Skin is warm and dry. Rash noted.     Nursing note and vitals reviewed.   ED Treatments / Results  Labs (all labs ordered are listed, but only abnormal results are displayed) Labs Reviewed  BASIC METABOLIC PANEL - Abnormal; Notable for the following:       Result Value   Glucose, Bld 139 (*)    All other components within normal limits  CBC - Abnormal; Notable for the following:    RBC 3.97 (*)    MCV 102.0 (*)    All other components within normal limits  TROPONIN I    EKG  EKG Interpretation None       Radiology Dg Chest 2 View  Result Date:  12/27/2015 CLINICAL DATA:  Recent pneumonia.  Rash. EXAM: CHEST  2 VIEW COMPARISON:  December 21, 2015 FINDINGS: The heart size and mediastinal contours are within normal limits. Both lungs are clear. The visualized skeletal structures are unremarkable.  IMPRESSION: No active cardiopulmonary disease. Electronically Signed   By: Gerome Sam III M.D   On: 12/27/2015 20:31    Procedures Procedures (including critical care time)  Medications Ordered in ED Medications  fluconazole (DIFLUCAN) tablet 200 mg (not administered)     Initial Impression / Assessment and Plan / ED Course  I have reviewed the triage vital signs and the nursing notes.  Pertinent labs & imaging results that were available during my care of the patient were reviewed by me and considered in my medical decision making (see chart for details).  Clinical Course   I had Dr. Rubin Payor look at rash as well, he feels like the rash is likely fungal and recommends Diflucan for treatment. He is to follow-up with his PCP within the next 24-48 hours. Take Benadryl as needed for itching.   I discussed results, diagnoses and plan with George Cruz. They voice there understanding and questions were answered. We discussed follow-up recommendations and return precautions.   Final Clinical Impressions(s) / ED Diagnoses   Final diagnoses:  Tinea corporis    New Prescriptions New Prescriptions   FLUCONAZOLE (DIFLUCAN) 100 MG TABLET    Take 1 tablet (100 mg total) by mouth daily.     Marlon Pel, PA-C 12/27/15 1610    Benjiman Core, MD 12/28/15 [phone]

## 2015-12-28 NOTE — ED Notes (Signed)
Pt stable and ambulatory. Acknowledges understanding of discharge instructions.

## 2015-12-28 NOTE — Care Management Note (Signed)
Case Management Note  Patient Details  Name: George Cruz MRN: [id number] Date of Birth: 04/21/1955  Subjective/Objective:     Pt called to say he was unable to afford medications               Action/Plan: Provided with MATCH. [facility]. Pt is aware that this program can only be used once/12 months.    Expected Discharge Date:                  Expected Discharge Plan:     In-House Referral:     Discharge planning Services     Post Acute Care Choice:    Choice offered to:     DME Arranged:    DME Agency:     HH Arranged:    HH Agency:     Status of Service:     If discussed at [employer] Meetings, dates discussed:    Additional Comments:  Yvone NeuCrutchfield, [name], RN 12/28/2015, 9:20 PM

## 2017-05-24 IMAGING — DX DG CHEST 2V
2 series · 2 of 2 positions shown · non-contrast
Comparison: December 21, 2015

CLINICAL DATA: Recent pneumonia.  Rash.

EXAM:
CHEST  2 VIEW

[chest pa]
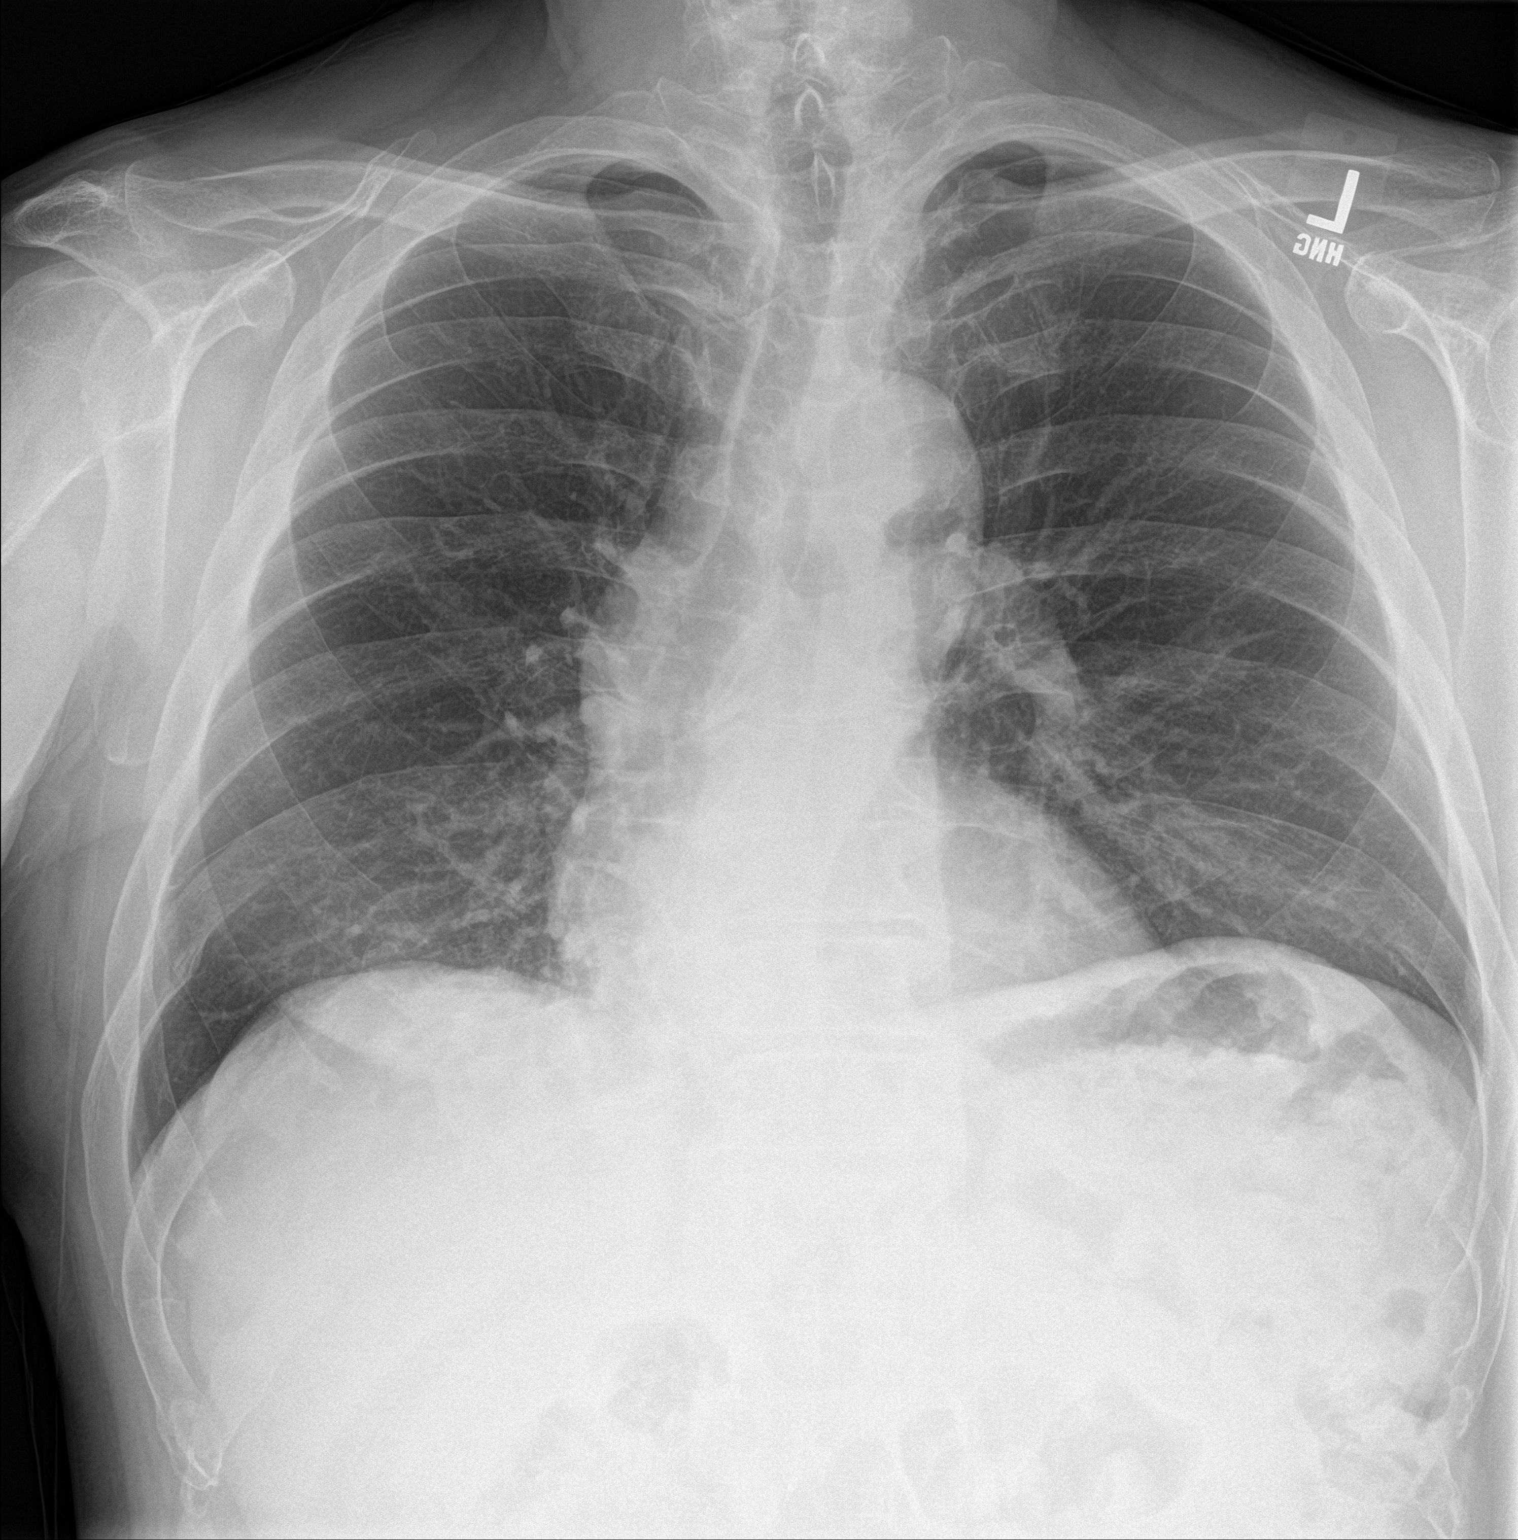

[chest lat]
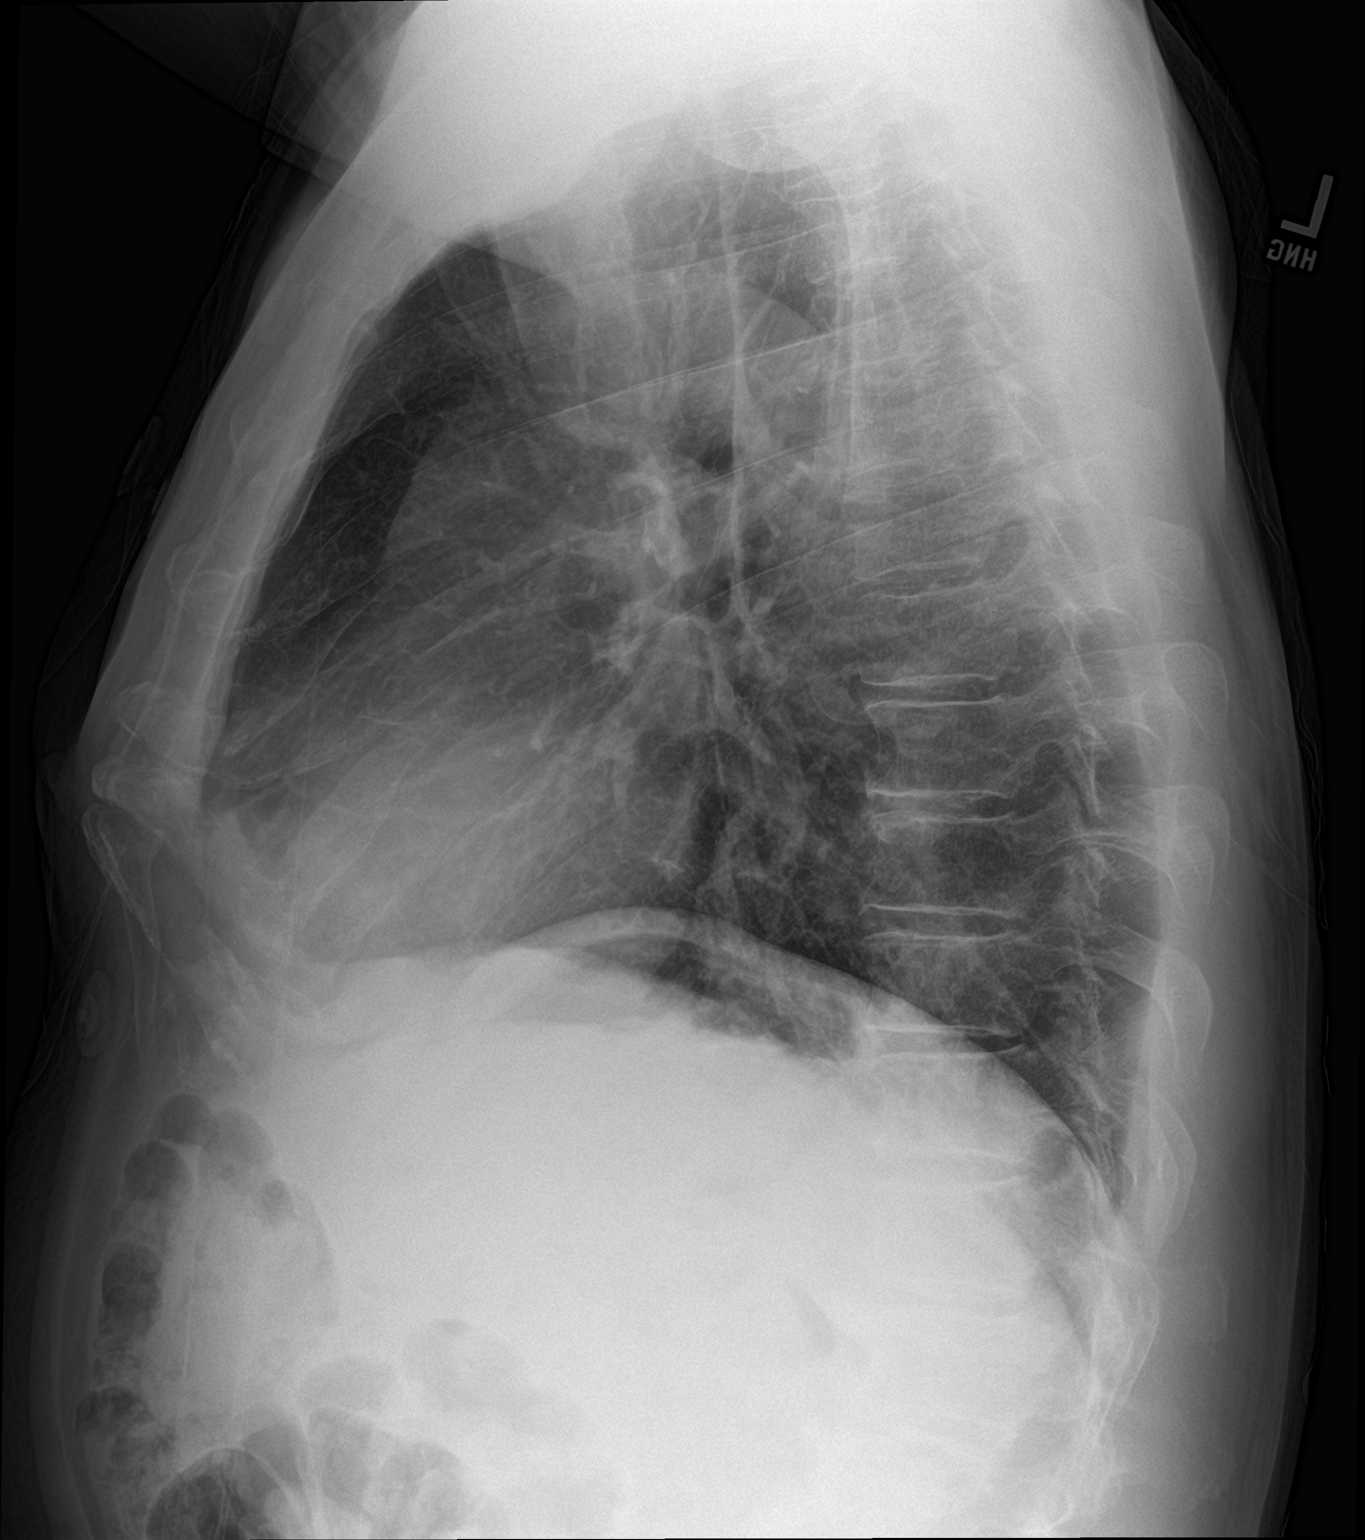

[2 of 2 positions shown; findings below may reference images not displayed]

FINDINGS: The heart size and mediastinal contours are within normal limits.
Both lungs are clear. The visualized skeletal structures are
unremarkable.
IMPRESSION: No active cardiopulmonary disease.
